# Patient Record
Sex: Male | Born: 1970 | Race: Black or African American | Hispanic: No | Marital: Married
Health system: Midwestern US, Academic
[De-identification: ages and names within clinical notes are randomized; demographics above are authoritative.]

---

## 2016-05-28 MED ORDER — ALBUTEROL SULFATE 90 MCG/ACTUATION IN HFAA
2 | RESPIRATORY_TRACT | 3 refills | Status: DC | PRN
Start: 2016-05-28 — End: 2016-07-09

## 2016-05-28 MED ORDER — FLUTICASONE-SALMETEROL 232-14 MCG/ACTUATION IN AEPB
1 | Freq: Two times a day (BID) | RESPIRATORY_TRACT | 3 refills | Status: DC
Start: 2016-05-28 — End: 2016-06-03

## 2016-06-03 MED ORDER — FLUTICASONE-SALMETEROL 232-14 MCG/ACTUATION IN AEPB
1 | Freq: Two times a day (BID) | RESPIRATORY_TRACT | 3 refills | Status: DC
Start: 2016-06-03 — End: 2019-01-05

## 2016-07-09 ENCOUNTER — Encounter: Admit: 2016-07-09 | Discharge: 2016-07-09 | Payer: No Typology Code available for payment source

## 2016-07-09 MED ORDER — ALBUTEROL SULFATE 90 MCG/ACTUATION IN HFAA
2 | RESPIRATORY_TRACT | 2 refills | Status: AC | PRN
Start: 2016-07-09 — End: ?

## 2016-07-09 MED ORDER — PREDNISONE 20 MG PO TAB
20 mg | ORAL_TABLET | Freq: Every day | ORAL | 1 refills | Status: SS
Start: 2016-07-09 — End: 2016-11-29

## 2016-07-09 NOTE — Telephone Encounter
Patient requested a script for prednisone states his voice is very raspy.  Patient denies much of a cough , when he does cough he states its very dry not productive. No fever  Reports feeling his chest is very tight or heavy.   Patients wife states in the last few weeks since the temperature has changed they have noticed his breathing getting worse or his chest feels more tight.     Offered patient appointment for 07/10/2016 at 1040am patient declined appointment. Patient was advise he could also go to urgent care if he felt like he needed prednisone.     Patient was last seen on 09/20/2015 and was to follow up on 01/23/2017 but cancelled appointment.    Patient currently has a follow up appointment scheduled for 09/25/2016 to follow up with Dr. Rondel OhBarkman.   Patients wife did request we refill the albuterol script.

## 2016-07-09 NOTE — Telephone Encounter
Per Dr. Rondel OhBarkman , we can order prednisone 20mg  take 1 tablet by mouth 4 days with 1 refill.   Patients wife notified. Scripts sent to [facility].

## 2016-07-22 ENCOUNTER — Encounter: Admit: 2016-07-22 | Discharge: 2016-07-22 | Payer: No Typology Code available for payment source

## 2016-07-22 DIAGNOSIS — T1490XA Injury, unspecified, initial encounter: Principal | ICD-10-CM

## 2016-07-23 ENCOUNTER — Encounter: Admit: 2016-07-23 | Discharge: 2016-07-23 | Payer: No Typology Code available for payment source

## 2016-07-24 ENCOUNTER — Encounter: Admit: 2016-07-24 | Discharge: 2016-07-24 | Payer: No Typology Code available for payment source

## 2016-07-26 ENCOUNTER — Encounter: Admit: 2016-07-26 | Discharge: 2016-07-26 | Payer: No Typology Code available for payment source

## 2016-11-27 ENCOUNTER — Encounter: Admit: 2016-11-27 | Discharge: 2016-11-27

## 2016-11-27 ENCOUNTER — Inpatient Hospital Stay
Admit: 2016-11-27 | Discharge: 2016-12-03 | Source: Other Acute Inpatient Hospital | Attending: Surgical Critical Care | Admitting: Trauma Surgery

## 2016-11-27 ENCOUNTER — Encounter: Admit: 2016-11-27 | Discharge: 2016-11-27 | Payer: No Typology Code available for payment source

## 2016-11-27 ENCOUNTER — Inpatient Hospital Stay: Admit: 2016-11-27 | Discharge: 2016-11-27 | Payer: No Typology Code available for payment source

## 2016-11-27 DIAGNOSIS — W3400XA Accidental discharge from unspecified firearms or gun, initial encounter: ICD-10-CM

## 2016-11-27 DIAGNOSIS — R69 Illness, unspecified: Principal | ICD-10-CM

## 2016-11-27 DIAGNOSIS — S82831B Other fracture of upper and lower end of right fibula, initial encounter for open fracture type I or II: ICD-10-CM

## 2016-11-27 DIAGNOSIS — J4520 Mild intermittent asthma, uncomplicated: ICD-10-CM

## 2016-11-27 DIAGNOSIS — M25551 Pain in right hip: ICD-10-CM

## 2016-11-27 DIAGNOSIS — S82391E Other fracture of lower end of right tibia, subsequent encounter for open fracture type I or II with routine healing: ICD-10-CM

## 2016-11-27 MED ORDER — HALOPERIDOL LACTATE 5 MG/ML IJ SOLN
1 mg | Freq: Once | INTRAVENOUS | 0 refills | Status: AC | PRN
Start: 2016-11-27 — End: ?

## 2016-11-27 MED ORDER — LACTATED RINGERS IV SOLP
1000 mL | Freq: Once | INTRAVENOUS | 0 refills | Status: CP
Start: 2016-11-27 — End: ?

## 2016-11-27 MED ORDER — ACETAMINOPHEN 325 MG PO TAB
650 mg | ORAL | 0 refills | Status: DC | PRN
Start: 2016-11-27 — End: 2016-11-28

## 2016-11-27 MED ORDER — MIDAZOLAM 1 MG/ML IJ SOLN
INTRAVENOUS | 0 refills | Status: DC
Start: 2016-11-27 — End: 2016-11-28
  Administered 2016-11-28: 04:00:00 2 mg via INTRAVENOUS

## 2016-11-27 MED ORDER — PROPOFOL INJ 10 MG/ML IV VIAL
0 refills | Status: DC
Start: 2016-11-27 — End: 2016-11-28
  Administered 2016-11-28 (×4): 10 mg via INTRAVENOUS
  Administered 2016-11-28: 04:00:00 20 mg via INTRAVENOUS
  Administered 2016-11-28: 04:00:00 50 mg via INTRAVENOUS
  Administered 2016-11-28: 04:00:00 10 mg via INTRAVENOUS
  Administered 2016-11-28: 04:00:00 20 mg via INTRAVENOUS
  Administered 2016-11-28 (×2): 10 mg via INTRAVENOUS
  Administered 2016-11-28: 04:00:00 50 mg via INTRAVENOUS
  Administered 2016-11-28: 04:00:00 20 mg via INTRAVENOUS
  Administered 2016-11-28 (×2): 10 mg via INTRAVENOUS

## 2016-11-27 MED ORDER — HYDROMORPHONE (PF) 2 MG/ML IJ SYRG
1 mg | INTRAVENOUS | 0 refills | Status: DC | PRN
Start: 2016-11-27 — End: 2016-11-28

## 2016-11-27 MED ORDER — OXYCODONE 5 MG PO TAB
5-10 mg | ORAL | 0 refills | Status: DC | PRN
Start: 2016-11-27 — End: 2016-11-28

## 2016-11-27 MED ORDER — FENTANYL CITRATE (PF) 50 MCG/ML IJ SOLN
50 ug | Freq: Once | INTRAVENOUS | 0 refills | Status: CP
Start: 2016-11-27 — End: ?
  Administered 2016-11-28: 01:00:00 50 ug via INTRAVENOUS

## 2016-11-27 MED ORDER — AMPICILLIN/SULBACTAM 3G/100ML NS IVPB (MB+)
3 g | INTRAVENOUS | 0 refills | Status: DC
Start: 2016-11-27 — End: 2016-11-28
  Administered 2016-11-28 (×4): 3 g via INTRAVENOUS

## 2016-11-27 MED ORDER — ONDANSETRON HCL (PF) 4 MG/2 ML IJ SOLN
4 mg | INTRAVENOUS | 0 refills | Status: DC | PRN
Start: 2016-11-27 — End: 2016-12-03

## 2016-11-27 MED ORDER — FLUTICASONE-SALMETEROL 250-50 MCG/DOSE IN DSDV
1 | Freq: Two times a day (BID) | RESPIRATORY_TRACT | 0 refills | Status: DC
Start: 2016-11-27 — End: 2016-12-03
  Administered 2016-11-28: 11:00:00 1 via RESPIRATORY_TRACT

## 2016-11-27 MED ORDER — FENTANYL CITRATE (PF) 50 MCG/ML IJ SOLN
50 ug | INTRAVENOUS | 0 refills | Status: DC | PRN
Start: 2016-11-27 — End: 2016-11-28

## 2016-11-27 MED ORDER — ALBUTEROL SULFATE 90 MCG/ACTUATION IN HFAA
2 | RESPIRATORY_TRACT | 0 refills | Status: DC | PRN
Start: 2016-11-27 — End: 2016-12-03
  Administered 2016-12-01: 05:00:00 2 via RESPIRATORY_TRACT

## 2016-11-27 MED ORDER — HELP MEDICATION
Freq: Two times a day (BID) | 0 refills | Status: DC
Start: 2016-11-27 — End: 2016-11-28

## 2016-11-27 MED ORDER — FENTANYL CITRATE (PF) 50 MCG/ML IJ SOLN
50 ug | Freq: Once | INTRAVENOUS | 0 refills | Status: CP
Start: 2016-11-27 — End: ?
  Administered 2016-11-28: 50 ug via INTRAVENOUS

## 2016-11-27 MED ORDER — PROMETHAZINE 25 MG/ML IJ SOLN
6.25 mg | INTRAVENOUS | 0 refills | Status: DC | PRN
Start: 2016-11-27 — End: 2016-11-28

## 2016-11-27 MED ORDER — FENTANYL CITRATE (PF) 50 MCG/ML IJ SOLN
0 refills | Status: DC
Start: 2016-11-27 — End: 2016-11-28
  Administered 2016-11-28: 04:00:00 50 ug via INTRAVENOUS

## 2016-11-27 MED ORDER — HYDROMORPHONE (PF) 2 MG/ML IJ SYRG
1 mg | Freq: Once | INTRAVENOUS | 0 refills | Status: CP
Start: 2016-11-27 — End: ?
  Administered 2016-11-28: 02:00:00 1 mg via INTRAVENOUS

## 2016-11-27 MED ORDER — FENTANYL CITRATE (PF) 50 MCG/ML IJ SOLN
25 ug | INTRAVENOUS | 0 refills | Status: DC | PRN
Start: 2016-11-27 — End: 2016-11-28

## 2016-11-27 MED ORDER — FENTANYL CITRATE (PF) 50 MCG/ML IJ SOLN
25-50 ug | INTRAVENOUS | 0 refills | Status: DC | PRN
Start: 2016-11-27 — End: 2016-11-28

## 2016-11-27 MED ORDER — HYDROMORPHONE IN 0.9 % NACL 11 MG/55 ML IJ SPCA (COPY)
INTRAVENOUS | 0 refills | Status: DC
Start: 2016-11-27 — End: 2016-11-28
  Administered 2016-11-28: 06:00:00 55.000 mL via INTRAVENOUS

## 2016-11-27 MED ORDER — KETAMINE 10 MG/ML IJ SOLN
0 refills | Status: DC
Start: 2016-11-27 — End: 2016-11-28
  Administered 2016-11-28 (×3): 10 mg via INTRAVENOUS

## 2016-11-27 MED ORDER — NALOXONE 0.4 MG/ML IJ SOLN
.08 mg | INTRAVENOUS | 0 refills | Status: DC | PRN
Start: 2016-11-27 — End: 2016-11-28

## 2016-11-27 MED ORDER — ONDANSETRON HCL (PF) 4 MG/2 ML IJ SOLN
4 mg | Freq: Once | INTRAVENOUS | 0 refills | Status: AC | PRN
Start: 2016-11-27 — End: ?

## 2016-11-27 MED ORDER — LACTATED RINGERS IV SOLP
0 refills | Status: DC
Start: 2016-11-27 — End: 2016-11-28
  Administered 2016-11-28: 03:00:00 via INTRAVENOUS
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
## 2016-11-28 ENCOUNTER — Inpatient Hospital Stay: Admit: 2016-11-28 | Discharge: 2016-11-28 | Payer: No Typology Code available for payment source

## 2016-11-28 ENCOUNTER — Encounter: Admit: 2016-11-28 | Discharge: 2016-11-28 | Payer: No Typology Code available for payment source

## 2016-11-28 DIAGNOSIS — M25551 Pain in right hip: ICD-10-CM

## 2016-11-28 DIAGNOSIS — W3400XA Accidental discharge from unspecified firearms or gun, initial encounter: ICD-10-CM

## 2016-11-28 DIAGNOSIS — J45909 Unspecified asthma, uncomplicated: Principal | ICD-10-CM

## 2016-11-28 LAB — COMPREHENSIVE METABOLIC PANEL
Lab: 0.9 mg/dL (ref 0.3–1.2)
Lab: 106 MMOL/L — ABNORMAL LOW (ref 98–110)
Lab: 136 MMOL/L — ABNORMAL LOW (ref 137–147)
Lab: 21 U/L (ref 7–56)
Lab: 22 MMOL/L (ref 21–30)
Lab: 3.6 g/dL (ref 3.5–5.0)
Lab: 36 U/L (ref 7–40)
Lab: 4.1 MMOL/L — ABNORMAL LOW (ref 3.5–5.1)
Lab: 8 (ref 3–12)
Lab: >60 mL/min (ref >60)

## 2016-11-28 LAB — PHOSPHORUS: Lab: 3.6 mg/dL (ref 2.0–4.5)

## 2016-11-28 LAB — CBC: Lab: 12 10*3/uL — ABNORMAL HIGH (ref 4.5–11.0)

## 2016-11-28 LAB — MAGNESIUM: Lab: 1.9 mg/dL (ref 1.6–2.6)

## 2016-11-28 MED ORDER — ACETAMINOPHEN 325 MG PO TAB
650 mg | ORAL | 0 refills | Status: DC
Start: 2016-11-28 — End: 2016-12-01
  Administered 2016-11-28 – 2016-12-01 (×11): 650 mg via ORAL

## 2016-11-28 MED ORDER — OXYCODONE 5 MG PO TAB
5-10 mg | ORAL | 0 refills | Status: DC | PRN
Start: 2016-11-28 — End: 2016-11-28
  Administered 2016-11-28: 22:00:00 10 mg via ORAL

## 2016-11-28 MED ORDER — MEPERIDINE (PF) 25 MG/ML IJ SYRG
12.5 mg | INTRAVENOUS | 0 refills | Status: DC | PRN
Start: 2016-11-28 — End: 2016-11-28

## 2016-11-28 MED ORDER — PHENYLEPHRINE IN 0.9% NACL(PF) 1 MG/10 ML (100 MCG/ML) IV SYRG
INTRAVENOUS | 0 refills | Status: DC
Start: 2016-11-28 — End: 2016-11-28
  Administered 2016-11-28 (×3): 100 ug via INTRAVENOUS

## 2016-11-28 MED ORDER — FENTANYL CITRATE (PF) 50 MCG/ML IJ SOLN
50 ug | INTRAVENOUS | 0 refills | Status: DC | PRN
Start: 2016-11-28 — End: 2016-11-28

## 2016-11-28 MED ORDER — HALOPERIDOL LACTATE 5 MG/ML IJ SOLN
1 mg | Freq: Once | INTRAVENOUS | 0 refills | Status: DC | PRN
Start: 2016-11-28 — End: 2016-11-28

## 2016-11-28 MED ORDER — LIDOCAINE (PF) 200 MG/10 ML (2 %) IJ SYRG
0 refills | Status: DC
Start: 2016-11-28 — End: 2016-11-28
  Administered 2016-11-28: 19:00:00 100 mg via INTRAVENOUS

## 2016-11-28 MED ORDER — HYDROMORPHONE (PF) 2 MG/ML IJ SYRG
1 mg | INTRAVENOUS | 0 refills | Status: DC | PRN
Start: 2016-11-28 — End: 2016-11-28

## 2016-11-28 MED ORDER — DIPHENHYDRAMINE HCL 50 MG/ML IJ SOLN
25 mg | Freq: Once | INTRAVENOUS | 0 refills | Status: DC | PRN
Start: 2016-11-28 — End: 2016-11-28

## 2016-11-28 MED ORDER — LACTATED RINGERS IV SOLP
1000 mL | INTRAVENOUS | 0 refills | Status: DC
Start: 2016-11-28 — End: 2016-11-30
  Administered 2016-11-28: 18:00:00 1000.000 mL via INTRAVENOUS

## 2016-11-28 MED ORDER — METHOCARBAMOL 750 MG PO TAB
750 mg | Freq: Two times a day (BID) | ORAL | 0 refills | Status: DC
Start: 2016-11-28 — End: 2016-11-29
  Administered 2016-11-28 – 2016-11-29 (×3): 750 mg via ORAL

## 2016-11-28 MED ORDER — SENNOSIDES 8.6 MG PO TAB
1 | Freq: Two times a day (BID) | ORAL | 0 refills | Status: DC
Start: 2016-11-28 — End: 2016-12-03
  Administered 2016-11-29 – 2016-12-02 (×8): 1 via ORAL

## 2016-11-28 MED ORDER — POLYETHYLENE GLYCOL 3350 17 GRAM PO PWPK
1 | Freq: Every day | ORAL | 0 refills | Status: DC
Start: 2016-11-28 — End: 2016-12-03
  Administered 2016-11-29 – 2016-12-02 (×4): 17 g via ORAL

## 2016-11-28 MED ORDER — KETAMINE 10 MG/ML IJ SOLN
0 refills | Status: DC
Start: 2016-11-28 — End: 2016-11-28
  Administered 2016-11-28: 19:00:00 30 mg via INTRAVENOUS

## 2016-11-28 MED ORDER — FENTANYL CITRATE (PF) 50 MCG/ML IJ SOLN
25 ug | INTRAVENOUS | 0 refills | Status: DC | PRN
Start: 2016-11-28 — End: 2016-11-28

## 2016-11-28 MED ORDER — DEXAMETHASONE SODIUM PHOSPHATE 4 MG/ML IJ SOLN
INTRAVENOUS | 0 refills | Status: DC
Start: 2016-11-28 — End: 2016-11-28
  Administered 2016-11-28: 19:00:00 4 mg via INTRAVENOUS

## 2016-11-28 MED ORDER — OXYCODONE 5 MG PO TAB
5-10 mg | Freq: Once | ORAL | 0 refills | Status: DC | PRN
Start: 2016-11-28 — End: 2016-11-28

## 2016-11-28 MED ORDER — ROCURONIUM 10 MG/ML IV SOLN
INTRAVENOUS | 0 refills | Status: DC
Start: 2016-11-28 — End: 2016-11-28
  Administered 2016-11-28: 19:00:00 20 mg via INTRAVENOUS
  Administered 2016-11-28: 19:00:00 50 mg via INTRAVENOUS

## 2016-11-28 MED ORDER — PROPOFOL INJ 10 MG/ML IV VIAL
0 refills | Status: DC
Start: 2016-11-28 — End: 2016-11-28
  Administered 2016-11-28: 19:00:00 200 mg via INTRAVENOUS

## 2016-11-28 MED ORDER — OXYCODONE 5 MG PO TAB
5-15 mg | ORAL | 0 refills | Status: DC | PRN
Start: 2016-11-28 — End: 2016-12-02
  Administered 2016-11-28 – 2016-11-29 (×2): 5 mg via ORAL
  Administered 2016-11-29: 22:00:00 15 mg via ORAL
  Administered 2016-11-29: 07:00:00 10 mg via ORAL
  Administered 2016-11-29: 18:00:00 15 mg via ORAL
  Administered 2016-11-29 (×3): 10 mg via ORAL
  Administered 2016-11-30 – 2016-12-02 (×14): 15 mg via ORAL

## 2016-11-28 MED ORDER — ONDANSETRON HCL (PF) 4 MG/2 ML IJ SOLN
INTRAVENOUS | 0 refills | Status: DC
Start: 2016-11-28 — End: 2016-11-28
  Administered 2016-11-28: 19:00:00 4 mg via INTRAVENOUS

## 2016-11-28 MED ORDER — SUGAMMADEX 100 MG/ML IV SOLN
INTRAVENOUS | 0 refills | Status: DC
Start: 2016-11-28 — End: 2016-11-28
  Administered 2016-11-28: 19:00:00 200 mg via INTRAVENOUS

## 2016-11-28 MED ORDER — DEXMEDETOMIDINE 4 MCG/ML (INFUSION)(AM)(OR)
0 refills | Status: DC
Start: 2016-11-28 — End: 2016-11-28
  Administered 2016-11-28: 19:00:00 1 ug/kg/h via INTRAVENOUS

## 2016-11-28 MED ORDER — DEXTRAN 70-HYPROMELLOSE (PF) 0.1-0.3 % OP DPET
0 refills | Status: DC
Start: 2016-11-28 — End: 2016-11-28
  Administered 2016-11-28: 19:00:00 1 [drp] via OPHTHALMIC

## 2016-11-28 MED ORDER — PROMETHAZINE 25 MG/ML IJ SOLN
6.25 mg | INTRAVENOUS | 0 refills | Status: DC | PRN
Start: 2016-11-28 — End: 2016-11-28

## 2016-11-28 MED ORDER — FENTANYL CITRATE (PF) 50 MCG/ML IJ SOLN
25-50 ug | INTRAVENOUS | 0 refills | Status: DC | PRN
Start: 2016-11-28 — End: 2016-11-29
  Administered 2016-11-29: 16:00:00 50 ug via INTRAVENOUS

## 2016-11-28 MED ORDER — BACITRACIN ZINC 500 UNIT/GRAM TP OINT
Freq: Every day | TOPICAL | 0 refills | Status: DC
Start: 2016-11-28 — End: 2016-12-03
  Administered 2016-11-28: 14:00:00 via TOPICAL

## 2016-11-28 MED ORDER — MIDAZOLAM 1 MG/ML IJ SOLN
INTRAVENOUS | 0 refills | Status: DC
Start: 2016-11-28 — End: 2016-11-28
  Administered 2016-11-28: 18:00:00 2 mg via INTRAVENOUS

## 2016-11-28 MED ORDER — FENTANYL CITRATE (PF) 50 MCG/ML IJ SOLN
0 refills | Status: DC
Start: 2016-11-28 — End: 2016-11-28
  Administered 2016-11-28: 19:00:00 100 ug via INTRAVENOUS

## 2016-11-28 MED ORDER — CEFAZOLIN INJ 1GM IVP
2 g | INTRAVENOUS | 0 refills | Status: CP
Start: 2016-11-28 — End: ?
  Administered 2016-11-29 (×3): 2 g via INTRAVENOUS

## 2016-11-28 MED ADMIN — SODIUM CHLORIDE 0.9 % IV SOLP [27838]: 1000 mL | INTRAVENOUS | @ 06:00:00 | Stop: 2016-11-28 | NDC 00338004904

## 2016-11-28 MED ADMIN — FENTANYL CITRATE (PF) 50 MCG/ML IJ SOLN [3037]: 100 ug | INTRAVENOUS | @ 02:00:00 | Stop: 2016-11-28 | NDC 00409909412

## 2016-11-28 MED ADMIN — LACTATED RINGERS IV SOLP [4318]: 1000 mL | INTRAVENOUS | @ 03:00:00 | Stop: 2016-11-28 | NDC 00338011704
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
## 2016-11-28 NOTE — Progress Notes
Patient's wedding band and bandana placed in safe at admissions.    Envelope #: [id number]    Reciept placed in paper chart.

## 2016-11-28 NOTE — Progress Notes
Chaplain Note: Chaplain prayed with patient before his surgery.     Admit Date: 11/27/2016      Date/Time:                      User:                                     11/27/2016 10:18 PM Carroll Kinds      PCU 3 PCU             The spiritual care team is available as needed, 24/7, through the campus switchboard [phone]).  For immediate response, please page [phone].  For a response within 24 hours, please submit an order in [facility] for a chaplain consult or call the administrative voicemail at [phone].
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
## 2016-11-28 NOTE — Anesthesia Pain Rounding
Anesthesia Follow-Up Evaluation: Post-Procedure Day One    Name: Bill [name]     MRN: [id number]     DOB: 1970-10-31     Age: 46 y.o.     Sex: male   __________________________________________________________________________     Procedure Date: 11/27/2016   Procedure: Procedure(s):  CLOSED TREATMENT ANKLE DISLOCATION:  closed reduction and splinting    Physical Assessment     Weight: 108.7 kg (239 lb 10.2 oz)    Vital Signs (Last Filed in 24 hours)  BP: 124/74 (11/01 0727)  Temp: 37 ???C (98.6 ???F) (11/01 0727)  Pulse: 74 (11/01 0727)  Respirations: 16 PER MINUTE (11/01 0727)  SpO2: 96 % (11/01 0727)  O2 Delivery: None (Room Air) (11/01 0727)  SpO2 Pulse: 59 (10/31 2345)    Patient History   Allergies  No Known Allergies     Medications  Scheduled Meds:  acetaminophen (TYLENOL) tablet 650 mg 650 mg Oral Q6H*   ampicillin/sulbactam (UNASYN) 3 g in sodium chloride 0.9% (NS) 100 mL IVPB (MB+) 3 g Intravenous Q6H*   bacitracin topical ointment  Topical QDAY   fluticasone/salmeterol (ADVAIR DISKUS) 250/50 mcg inhalation disk 1 puff 1 puff Inhalation BID   methocarbamol (ROBAXIN) tablet 750 mg 750 mg Oral BID   polyethylene glycol 3350 (MIRALAX) packet 17 g 1 packet Oral QDAY   senna (SENOKOT) tablet 1 tablet 1 tablet Oral BID   Continuous Infusions:  ??? HYDROmorphone (DILAUDID) PCA   11 mg/NS 55mL infusion syr (std conc)(premade)       PRN and Respiratory Meds:albuterol Q6H PRN, naloxone PRN, ondansetron (ZOFRAN) IV Q6H PRN, oxyCODONE Q4H PRN      Diagnostic Tests  Hematology: Lab Results   Component Value Date    HGB 9.9 11/28/2016    HCT 30.1 11/28/2016    PLTCT 208 11/28/2016    WBC 12.4 11/28/2016    MCV 88.5 11/28/2016    MCH 28.9 11/28/2016    MCHC 32.7 11/28/2016    MPV 7.8 11/28/2016    RDW 14.0 11/28/2016         General Chemistry: Lab Results   Component Value Date    NA 136 11/28/2016    K 4.1 11/28/2016    CL 106 11/28/2016    CO2 22 11/28/2016    GAP 8 11/28/2016    BUN 12 11/28/2016 CR 0.78 11/28/2016    GLU 120 11/28/2016    CA 8.6 11/28/2016    ALBUMIN 3.6 11/28/2016    MG 1.9 11/28/2016    TOTBILI 0.9 11/28/2016    PO4 3.6 11/28/2016      Coagulation: No results found for: PT, PTT, INR      Follow-Up Assessment  Patient location during evaluation: floor      Anesthetic Complications:   Anesthetic complications: The patient did not experience any anesthestic complications.      Pain:    Management:adequate     Level of Consciousness: responsive to verbal stimuli   Hydration:acceptable     Airway Patency: patent   Respiratory Status: acceptable and room air     Cardiovascular Status:acceptable   Regional/Neuroaxial:              Comments: PCA   pt appears to be resting comfortably in bed at this time.
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
## 2016-11-28 NOTE — Progress Notes
Chaplain services provided to patient prior to closed reduction.
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
## 2016-11-28 NOTE — Progress Notes
OCCUPATIONAL THERAPY  NOTE    Occupational Therapy orders received and appreciated; chart reviewed. Patient to OR this date with Ortho Plan for operative fixation of R ankle fx/dislocation. OT will follow-up post-operatively to complete evaluation/provide intervention as indicated.    Bertram Millard, OTR/L [phone]

## 2016-11-28 NOTE — Anesthesia Post-Procedure Evaluation
Post-Anesthesia Evaluation    Name: Bill [name]      MRN: [id number]     DOB: [birth year]     Age: 46 y.o.     Sex: male   __________________________________________________________________________     Procedure Date: 11/27/2016  Procedure: Procedure(s):  CLOSED TREATMENT ANKLE DISLOCATION:  closed reduction and splinting      Surgeon: Surgeon(s):  Dorothea Glassman, MD    Post-Anesthesia Vitals  BP: 125/71 (10/31 2330)  Pulse: 53 (10/31 2330)  Respirations: 13 PER MINUTE (10/31 2330)  SpO2: 100 % (10/31 2330)  O2 Delivery: Nasal Cannula (10/31 2330)  SpO2 Pulse: 52 (10/31 2330)      Post Anesthesia Evaluation Note    Evaluation location: Pre/Post  Patient participation: recovered; patient participated in evaluation  Level of consciousness: alert    Pain score: 3  Pain management: adequate    Hydration: normovolemia  Temperature: 36.0C - 38.4C  Airway patency: adequate    Perioperative Events  Perioperative events:  no       Post-op nausea and vomiting: no PONV    Postoperative Status  Cardiovascular status: hemodynamically stable  Respiratory status: spontaneous ventilation  Follow-up needed: none        Perioperative Events  Perioperative Event: No

## 2016-11-28 NOTE — Progress Notes
RT Adult Assessment Note    NAME:Bill [name]             MRN: [id number]             DOB:07-19-1970          AGE: 46 y.o.  ADMISSION DATE: 11/27/2016             DAYS ADMITTED: LOS: 0 days    RT Treatment Plan:  Protocol Plan: Medications  Albuterol: MDI PRN (home regiment)    Protocol Plan: Procedures  Comment: Advair BID    Additional Comments:  Impressions of the patient: laying in bed, no respiratory distress noted      Vital Signs:  Pulse: Pulse: 76  RR: Respirations: 18 PER MINUTE  SpO2: SpO2: 98 %  O2 Device:    Liter Flow:    O2%: O2 Percent: 21 %  Breath Sounds: All Breath Sounds: Clear (implies normal)  Respiratory Effort: Respiratory Effort: Non-Labored

## 2016-11-28 NOTE — Progress Notes
PHYSICAL THERAPY  NOTE     Plan for patient to OR this date with Ortho Plan for operative fixation of R ankle fx/dislocation. Therapy will follow-up post-operatively to complete evaluation/provide intervention as indicated.        Therapist: Romana Juniper, PT  Date: 11/28/2016
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
## 2016-11-28 NOTE — Progress Notes
Ortho Update Note    Plan for OR today for operative fixation of R ankle fx/dislocation  NPO  Type and cross  Posted/consented      Tillie Fantasia, MD  [phone]

## 2016-11-28 NOTE — Progress Notes
Patient arrived to room # [phone]) via cart accompanied by EMS. Patient transferred to the bed with assistance. Bedside safety checks completed. Initial patient assessment completed, refer to flowsheet for details. Admission skin assessment completed by: Cathlean Sauer, RN & Malva Limes, RN     Pressure Injury Present on Hospital Admission (within 24 hours): No    1. Occiput: No  2. Ear: No  3. Scapula: No  4. Spinous Process: No  5. Shoulder: No  6. Elbow: No  7. Iliac Crest: No  8. Sacrum/Coccyx: No  9. Ischial Tuberosity: No  10. Trochanter: No  11. Knee: No  12. Malleolus: No  13. Heel: No  14. Toes: No  15. Assessed for device associated injury Yes  16. Nursing Nutrition Assessment Completed Yes    See Doc Flowsheet for additional wound details.     INTERVENTIONS:   Assessment completed, refer to flowsheet for details. Orders released, reviewed, and implemented as appropriate. Oriented to surroundings, call light within reach. Plan of care reviewed.  Will continue to monitor and assess.

## 2016-11-29 ENCOUNTER — Encounter: Admit: 2016-11-29 | Discharge: 2016-11-29 | Payer: No Typology Code available for payment source

## 2016-11-29 DIAGNOSIS — W3400XA Accidental discharge from unspecified firearms or gun, initial encounter: ICD-10-CM

## 2016-11-29 DIAGNOSIS — J45909 Unspecified asthma, uncomplicated: Principal | ICD-10-CM

## 2016-11-29 MED ORDER — ENOXAPARIN 30 MG/0.3 ML SC SYRG
30 mg | Freq: Two times a day (BID) | SUBCUTANEOUS | 0 refills | Status: DC
Start: 2016-11-29 — End: 2016-12-03
  Administered 2016-11-29 – 2016-12-02 (×6): 30 mg via SUBCUTANEOUS

## 2016-11-29 MED ORDER — METHOCARBAMOL 750 MG PO TAB
750 mg | Freq: Four times a day (QID) | ORAL | 0 refills | Status: DC
Start: 2016-11-29 — End: 2016-12-03
  Administered 2016-11-29 – 2016-12-02 (×14): 750 mg via ORAL

## 2016-11-29 MED ORDER — GABAPENTIN 300 MG PO CAP
300 mg | ORAL | 0 refills | Status: DC
Start: 2016-11-29 — End: 2016-12-02
  Administered 2016-11-29 – 2016-12-02 (×8): 300 mg via ORAL

## 2016-11-29 MED ORDER — HYDROMORPHONE (PF) 2 MG/ML IJ SYRG
1 mg | INTRAVENOUS | 0 refills | Status: DC | PRN
Start: 2016-11-29 — End: 2016-11-30
  Administered 2016-11-29 – 2016-11-30 (×2): 1 mg via INTRAVENOUS
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
## 2016-11-29 NOTE — Progress Notes
Chaplain Note: Chaplain attempted visit, however pt received phone call at the same time. Chaplain will return at a more convenient time for patient.    Admit Date: 11/27/2016      Date/Time:                      User:                                    11/29/2016 3:29 PM Carroll Kinds      PCU 2 PCU
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
## 2016-11-30 NOTE — Care Plan
Problem: Discharge Planning  Goal: Participation in plan of care  Outcome: Goal Ongoing  Pt participating and verbalizes the plan of care during this shift. Will continue to monitor    Goal: Knowledge regarding plan of care  Outcome: Goal Ongoing  Pt understands and verbalizes plan of care during this shift. Pt's education binder will be updated and placed in the pt's wall unit. Will continue to monitor   Goal: Prepared for discharge  Outcome: Goal Ongoing  Discharge planning ongoing. Will continue to monitor   ???  ???  Problem: Pain  Goal: Management of pain  Outcome: Goal Ongoing  Pt has reported 10/10 pain in his right leg during this shift. Pt has a dilaudid PCA and has been encouraged to press it when in pain. Pt is currently resting. Will continue to monitor   Goal: Knowledge of pain management  Outcome: Goal Ongoing  Pt educated and verbalizes understanding of pain management during this shift. Will continue to monitor   ???  Problem: Respiratory Impairment (Non-Ventilated Patient)  Goal: Effective gas exchange  Outcome: Goal Ongoing  Pt's SPO2 has stayed >92% on room air during this shift. No signs or symptoms of ineffective gas exchange during this shift. Pt's gas exchange is being monitored by capnography. Will continue to monitor   Goal: Effective breathing pattern  Outcome: Add Note  Pt's breathing was non-labored and chest expansion was symmetrical during this shift. No signs or symptoms of ineffective breathing patterns during this shift. Will continue to monitor   Goal: Patent airway  Outcome: Goal Ongoing  Pt has maintained a patent airway during this shift. Will continue to monitor   ???  Problem: Tissue Perfusion, Altered  Goal: Adequate tissue perfusion  Outcome: Goal Ongoing  Pt's pulses are palpable, capillary refill is less than 3 seconds, and extremities are warm. No signs or symptoms of inadequate tissue perfusion. Will continue to monitor   ???  Problem: Skin Integrity  Goal: Skin integrity intact Outcome: Goal Ongoing  Pt's skin is healing and no new skin breakdown during this shift. Pt's dressings are clean, dry, and intact. Will continue to monitor   ???  Problem: Falls, High Risk of  Goal: Absence of falls-Adult Patient  Outcome: Goal Ongoing  Pt has had no falls during this shift. Fall bundle in place. Will continue to monitor   ???  ???
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
## 2016-11-30 NOTE — Progress Notes
RESPIRATORY THERAPY  ADULT PROTOCOL EVALUATION      RESPIRATORY PROTOCOL PLAN    Medications       Note: If indicated by protocol, medication orders will be placed by therapist.    Procedures          _____________________________________________________________    PATIENT EVALUATION RESULTS    Chart Review  * Pulmonary Hx: Hx pulmonary disease, hx reactive or obstructive airway disease (PEFR & AM) OR regular home use of bronchodilators (AM) OR inhaled or systemic steroid use for lungs < or equal to 4 times/yr (AM)    * Surgical Hx: No surgery OR last surgery > 6 weeks ago OR trach/stoma (BA)    * Chest X-Ray: Clear OR not available    * PFT/Oxygenation: FEV1, PEFR > 80% predicted OR physically unable to perform OR Pa02 >80 RA OR Sp02 >95% RA      Patient Assessment  * Respiratory Pattern: Regular pattern and rate OR good chest excursion with deep breathing    * Breath Sounds: Clear and able to auscultate bases posteriorly    * Cough / Sputum: Strong, effective cough OR nonproductive    * Mental Status: Alert, oriented, cooperative    * Activity Level: Ambulatory      Priority Index  Total Points: 3 Points  * Priority Index: 1+    PRIORITY INDEX GUIDELINES*  Priority Points   1 0-9 points   2 9-18 points   3 > 18 points   + Pulm Dx or Home Rx   *Higher points indicate higher acuity.      Therapist: Mont Dutton, RT  Date: 11/30/2016      Key  AC = Airway clearance  AM = Aerosolized medication  BA = Bland aerosol  DB&C = Deep breathe & cough  FEV1 = Forced expiratory volume in first second)  IC = Inspiratory capacity  LE = Lung expansion  MDI = Metered dose inhaler  Neb = Nebulizer  O2 = Oxygen  Oxim =Oximetry  PEFR = Peak expiratory flow rate  RRT = Rapid Response Team

## 2016-12-01 MED ORDER — HYDROMORPHONE (PF) 2 MG/ML IJ SYRG
1 mg | Freq: Once | INTRAVENOUS | 0 refills | Status: CP
Start: 2016-12-01 — End: ?
  Administered 2016-12-01: 21:00:00 1 mg via INTRAVENOUS

## 2016-12-01 MED ORDER — ACETAMINOPHEN 500 MG PO TAB
1000 mg | ORAL | 0 refills | Status: DC
Start: 2016-12-01 — End: 2016-12-03
  Administered 2016-12-01 – 2016-12-02 (×4): 1000 mg via ORAL
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
## 2016-12-02 ENCOUNTER — Encounter: Admit: 2016-12-02 | Discharge: 2016-12-02 | Payer: No Typology Code available for payment source

## 2016-12-02 DIAGNOSIS — J45909 Unspecified asthma, uncomplicated: Principal | ICD-10-CM

## 2016-12-02 DIAGNOSIS — W3400XA Accidental discharge from unspecified firearms or gun, initial encounter: ICD-10-CM

## 2016-12-02 MED ORDER — MAGNESIUM HYDROXIDE 2,400 MG/10 ML PO SUSP
10 mL | Freq: Once | ORAL | 0 refills | Status: DC
Start: 2016-12-02 — End: 2016-12-03

## 2016-12-02 MED ORDER — POLYETHYLENE GLYCOL 3350 17 GRAM PO PWPK
17 g | Freq: Every day | ORAL | 0 refills | 18.00000 days | Status: AC
Start: 2016-12-02 — End: 2019-01-05

## 2016-12-02 MED ORDER — ENOXAPARIN 40 MG/0.4 ML SC SYRG
40 mg | INJECTION | Freq: Every day | SUBCUTANEOUS | 0 refills | 7.00000 days | Status: AC
Start: 2016-12-02 — End: 2019-01-05

## 2016-12-02 MED ORDER — SENNOSIDES 8.6 MG PO TAB
1 | ORAL_TABLET | Freq: Two times a day (BID) | ORAL | 3 refills | Status: AC
Start: 2016-12-02 — End: 2019-01-05

## 2016-12-02 MED ORDER — GABAPENTIN 400 MG PO CAP
400 mg | ORAL | 0 refills | Status: DC
Start: 2016-12-02 — End: 2016-12-03
  Administered 2016-12-02: 19:00:00 400 mg via ORAL

## 2016-12-02 MED ORDER — GABAPENTIN 400 MG PO CAP
400 mg | ORAL_CAPSULE | ORAL | 1 refills | Status: SS
Start: 2016-12-02 — End: 2016-12-12

## 2016-12-02 MED ORDER — ACETAMINOPHEN 500 MG PO TAB
1000 mg | ORAL | 0 refills | Status: AC | PRN
Start: 2016-12-02 — End: 2019-08-17

## 2016-12-02 MED ORDER — OXYCODONE 5 MG PO TAB
5-15 mg | ORAL | 0 refills | Status: DC | PRN
Start: 2016-12-02 — End: 2016-12-02
  Administered 2016-12-02: 12:00:00 15 mg via ORAL

## 2016-12-02 MED ORDER — TRAMADOL 50 MG PO TAB
50-100 mg | ORAL_TABLET | ORAL | 0 refills | Status: SS | PRN
Start: 2016-12-02 — End: 2016-12-12

## 2016-12-02 MED ORDER — BACITRACIN ZINC 500 UNIT/GRAM TP OINT
0 refills | 14.00000 days | Status: AC
Start: 2016-12-02 — End: 2019-01-05

## 2016-12-02 MED ORDER — TRAMADOL 50 MG PO TAB
50-100 mg | ORAL | 0 refills | Status: DC | PRN
Start: 2016-12-02 — End: 2016-12-03
  Administered 2016-12-02 (×2): 50 mg via ORAL

## 2016-12-02 MED ORDER — METHOCARBAMOL 750 MG PO TAB
750 mg | ORAL_TABLET | Freq: Four times a day (QID) | ORAL | 1 refills | Status: AC
Start: 2016-12-02 — End: ?
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
## 2016-12-02 NOTE — Progress Notes
Time of Fall:  1250    Objective (location/activity at time):  Patient found on the floor.    Subjective:  Patient said  He was trying to transfer himself from  Wheelchair to bed and feel    MD/NP and Family Notified:   NP was notified.    Assessment:   assessment was  Done, pt denied hitting his head.        Follow-up Action (Diagnostic/Additional Safety Precautions):  Fall precaution are reimplimented.
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
## 2016-12-02 NOTE — Discharge Planning (AHS/AVS)
Prescription Drug Discount Resource  https://www.goodrx.com or go to m.goodrx.com from any mobile phone.     GoodRx gathers current prices and discounts to help you find the lowest cost pharmacy for your prescriptions. GoodRx is 100% free. No personal information required.
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
## 2016-12-03 ENCOUNTER — Inpatient Hospital Stay: Admit: 2016-11-27 | Discharge: 2016-11-27 | Payer: No Typology Code available for payment source

## 2016-12-03 ENCOUNTER — Encounter: Admit: 2016-12-03 | Discharge: 2016-12-03 | Payer: No Typology Code available for payment source

## 2016-12-03 ENCOUNTER — Inpatient Hospital Stay: Admit: 2016-11-28 | Discharge: 2016-11-28 | Payer: No Typology Code available for payment source

## 2016-12-03 DIAGNOSIS — G8911 Acute pain due to trauma: ICD-10-CM

## 2016-12-03 DIAGNOSIS — T797XXA Traumatic subcutaneous emphysema, initial encounter: ICD-10-CM

## 2016-12-03 DIAGNOSIS — Z79899 Other long term (current) drug therapy: ICD-10-CM

## 2016-12-03 DIAGNOSIS — R110 Nausea: ICD-10-CM

## 2016-12-03 DIAGNOSIS — D62 Acute posthemorrhagic anemia: ICD-10-CM

## 2016-12-03 DIAGNOSIS — S9304XA Dislocation of right ankle joint, initial encounter: ICD-10-CM

## 2016-12-03 DIAGNOSIS — S81841A Puncture wound with foreign body, right lower leg, initial encounter: ICD-10-CM

## 2016-12-03 DIAGNOSIS — Z7409 Other reduced mobility: ICD-10-CM

## 2016-12-03 DIAGNOSIS — J45909 Unspecified asthma, uncomplicated: ICD-10-CM

## 2016-12-03 DIAGNOSIS — S31824A Puncture wound with foreign body of left buttock, initial encounter: ICD-10-CM

## 2016-12-03 DIAGNOSIS — R402413 Glasgow coma scale score 13-15, at hospital admission: ICD-10-CM

## 2016-12-03 DIAGNOSIS — D72829 Elevated white blood cell count, unspecified: ICD-10-CM

## 2016-12-03 DIAGNOSIS — F17210 Nicotine dependence, cigarettes, uncomplicated: ICD-10-CM

## 2016-12-03 DIAGNOSIS — I10 Essential (primary) hypertension: ICD-10-CM

## 2016-12-03 DIAGNOSIS — S32612A Displaced avulsion fracture of left ischium, initial encounter for closed fracture: ICD-10-CM

## 2016-12-03 DIAGNOSIS — S82391B Other fracture of lower end of right tibia, initial encounter for open fracture type I or II: ICD-10-CM

## 2016-12-03 DIAGNOSIS — J4520 Mild intermittent asthma, uncomplicated: ICD-10-CM

## 2016-12-03 DIAGNOSIS — S82251B Displaced comminuted fracture of shaft of right tibia, initial encounter for open fracture type I or II: Principal | ICD-10-CM

## 2016-12-03 DIAGNOSIS — S82451B Displaced comminuted fracture of shaft of right fibula, initial encounter for open fracture type I or II: ICD-10-CM

## 2016-12-03 DIAGNOSIS — Z9889 Other specified postprocedural states: Principal | ICD-10-CM

## 2016-12-03 DIAGNOSIS — S82871A Displaced pilon fracture of right tibia, initial encounter for closed fracture: ICD-10-CM
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
## 2016-12-03 NOTE — Case Management (ED)
12/02/2016:  Bill [name] with [facility] picked up patient for discharge and [facility] Patient calm and cooperative.     Bill [name] ([facility]) at Phone: [phone]    Diana EvesAnnemarie Nitza Schmid, RN, BSN  Trauma & Burn Case Manager  Phone: [phone]  Fax: [phone]  Pager: [phone]

## 2016-12-09 DIAGNOSIS — I10 Essential (primary) hypertension: ICD-10-CM

## 2016-12-10 ENCOUNTER — Encounter: Admit: 2016-12-10 | Discharge: 2016-12-10 | Payer: No Typology Code available for payment source

## 2016-12-10 ENCOUNTER — Observation Stay: Admit: 2016-12-10 | Discharge: 2016-12-12

## 2016-12-10 ENCOUNTER — Encounter: Admit: 2016-12-10 | Discharge: 2016-12-11

## 2016-12-10 DIAGNOSIS — W3400XA Accidental discharge from unspecified firearms or gun, initial encounter: ICD-10-CM

## 2016-12-10 DIAGNOSIS — J45909 Unspecified asthma, uncomplicated: Principal | ICD-10-CM

## 2016-12-10 MED ORDER — BISACODYL 10 MG RE SUPP
10 mg | Freq: Every day | RECTAL | 0 refills | Status: DC | PRN
Start: 2016-12-10 — End: 2016-12-12

## 2016-12-10 MED ORDER — LIDOCAINE (PF) 10 MG/ML (1 %) IJ SOLN
.1-2 mL | INTRAMUSCULAR | 0 refills | Status: DC | PRN
Start: 2016-12-10 — End: 2016-12-10

## 2016-12-10 MED ORDER — LACTATED RINGERS IV SOLP
1000 mL | INTRAVENOUS | 0 refills | Status: DC
Start: 2016-12-10 — End: 2016-12-10
  Administered 2016-12-10: 19:00:00 1000.000 mL via INTRAVENOUS
  Administered 2016-12-10: 15:00:00 1000 mL via INTRAVENOUS

## 2016-12-10 MED ORDER — OXYCODONE 5 MG PO TAB
5-15 mg | ORAL | 0 refills | Status: DC | PRN
Start: 2016-12-10 — End: 2016-12-12
  Administered 2016-12-11: 06:00:00 10 mg via ORAL
  Administered 2016-12-11 (×3): 15 mg via ORAL
  Administered 2016-12-11: 05:00:00 5 mg via ORAL
  Administered 2016-12-11 (×2): 15 mg via ORAL
  Administered 2016-12-12 (×4): 10 mg via ORAL
  Administered 2016-12-12: 19:00:00 15 mg via ORAL

## 2016-12-10 MED ORDER — FENTANYL CITRATE (PF) 50 MCG/ML IJ SOLN
0 refills | Status: DC
Start: 2016-12-10 — End: 2016-12-10
  Administered 2016-12-10 (×2): 50 ug via INTRAVENOUS
  Administered 2016-12-10: 19:00:00 25 ug via INTRAVENOUS
  Administered 2016-12-10 (×3): 50 ug via INTRAVENOUS

## 2016-12-10 MED ORDER — HYDROMORPHONE (PF) 2 MG/ML IJ SYRG
.2-.5 mg | INTRAVENOUS | 0 refills | Status: DC | PRN
Start: 2016-12-10 — End: 2016-12-10
  Administered 2016-12-10: 22:00:00 0.2 mg via INTRAVENOUS
  Administered 2016-12-10 (×2): 0.4 mg via INTRAVENOUS

## 2016-12-10 MED ORDER — LIDOCAINE (PF) 200 MG/10 ML (2 %) IJ SYRG
0 refills | Status: DC
Start: 2016-12-10 — End: 2016-12-10
  Administered 2016-12-10: 17:00:00 100 mg via INTRAVENOUS

## 2016-12-10 MED ORDER — CEFAZOLIN INJ 1GM IVP
2 g | INTRAVENOUS | 0 refills | Status: DC
Start: 2016-12-10 — End: 2016-12-10

## 2016-12-10 MED ORDER — OXYCODONE 5 MG PO TAB
5-10 mg | ORAL | 0 refills | Status: DC | PRN
Start: 2016-12-10 — End: 2016-12-11
  Administered 2016-12-10 – 2016-12-11 (×2): 10 mg via ORAL

## 2016-12-10 MED ORDER — KETAMINE 10 MG/ML IJ SOLN
0 refills | Status: DC
Start: 2016-12-10 — End: 2016-12-10
  Administered 2016-12-10 (×2): 10 mg via INTRAVENOUS

## 2016-12-10 MED ORDER — DIPHENHYDRAMINE HCL 50 MG/ML IJ SOLN
25 mg | INTRAVENOUS | 0 refills | Status: DC | PRN
Start: 2016-12-10 — End: 2016-12-12

## 2016-12-10 MED ORDER — PROPOFOL INJ 10 MG/ML IV VIAL
0 refills | Status: DC
Start: 2016-12-10 — End: 2016-12-10
  Administered 2016-12-10: 19:00:00 50 mg via INTRAVENOUS
  Administered 2016-12-10: 19:00:00 100 mg via INTRAVENOUS
  Administered 2016-12-10: 17:00:00 50 mg via INTRAVENOUS
  Administered 2016-12-10: 17:00:00 200 mg via INTRAVENOUS

## 2016-12-10 MED ORDER — PHENYLEPHRINE IN 0.9% NACL(PF) 1 MG/10 ML (100 MCG/ML) IV SYRG
INTRAVENOUS | 0 refills | Status: DC
Start: 2016-12-10 — End: 2016-12-10
  Administered 2016-12-10: 20:00:00 100 ug via INTRAVENOUS
  Administered 2016-12-10 (×2): 200 ug via INTRAVENOUS

## 2016-12-10 MED ORDER — ACETAMINOPHEN 325 MG PO TAB
650 mg | ORAL | 0 refills | Status: DC | PRN
Start: 2016-12-10 — End: 2016-12-11
  Administered 2016-12-11 (×3): 650 mg via ORAL

## 2016-12-10 MED ORDER — ALBUTEROL SULFATE 90 MCG/ACTUATION IN HFAA
2 | RESPIRATORY_TRACT | 0 refills | Status: DC | PRN
Start: 2016-12-10 — End: 2016-12-12
  Administered 2016-12-11: 01:00:00 2 via RESPIRATORY_TRACT

## 2016-12-10 MED ORDER — ONDANSETRON HCL (PF) 4 MG/2 ML IJ SOLN
4 mg | Freq: Once | INTRAVENOUS | 0 refills | Status: DC | PRN
Start: 2016-12-10 — End: 2016-12-10

## 2016-12-10 MED ORDER — DEXTRAN 70-HYPROMELLOSE (PF) 0.1-0.3 % OP DPET
0 refills | Status: DC
Start: 2016-12-10 — End: 2016-12-10
  Administered 2016-12-10: 17:00:00 2 [drp] via OPHTHALMIC

## 2016-12-10 MED ORDER — CEFAZOLIN 1 GRAM IJ SOLR
0 refills | Status: DC
Start: 2016-12-10 — End: 2016-12-10
  Administered 2016-12-10: 17:00:00 2 g via INTRAVENOUS

## 2016-12-10 MED ORDER — MAGNESIUM HYDROXIDE 2,400 MG/10 ML PO SUSP
10 mL | Freq: Every day | ORAL | 0 refills | Status: DC
Start: 2016-12-10 — End: 2016-12-12
  Administered 2016-12-11 – 2016-12-12 (×2): 10 mL via ORAL

## 2016-12-10 MED ORDER — ACETAMINOPHEN 1,000 MG/100 ML (10 MG/ML) IV SOLN
0 refills | Status: DC
Start: 2016-12-10 — End: 2016-12-10
  Administered 2016-12-10: 18:00:00 1000 mg via INTRAVENOUS

## 2016-12-10 MED ORDER — ONDANSETRON HCL (PF) 4 MG/2 ML IJ SOLN
4 mg | INTRAVENOUS | 0 refills | Status: DC | PRN
Start: 2016-12-10 — End: 2016-12-12

## 2016-12-10 MED ORDER — CEFAZOLIN INJ 1GM IVP
2 g | INTRAVENOUS | 0 refills | Status: DC
Start: 2016-12-10 — End: 2016-12-11
  Administered 2016-12-11 (×2): 2 g via INTRAVENOUS

## 2016-12-10 MED ORDER — ENOXAPARIN 40 MG/0.4 ML SC SYRG
40 mg | Freq: Every day | SUBCUTANEOUS | 0 refills | Status: DC
Start: 2016-12-10 — End: 2016-12-12
  Administered 2016-12-12: 03:00:00 40 mg via SUBCUTANEOUS

## 2016-12-10 MED ORDER — DEXMEDETOMIDINE# 4MCG/ML IV SOLN
0 refills | Status: DC
Start: 2016-12-10 — End: 2016-12-10
  Administered 2016-12-10 (×2): 8 ug via INTRAVENOUS
  Administered 2016-12-10: 20:00:00 4 ug via INTRAVENOUS

## 2016-12-10 MED ORDER — FENTANYL CITRATE (PF) 50 MCG/ML IJ SOLN
25-50 ug | INTRAVENOUS | 0 refills | Status: DC | PRN
Start: 2016-12-10 — End: 2016-12-11
  Administered 2016-12-11 (×5): 50 ug via INTRAVENOUS

## 2016-12-10 MED ORDER — HALOPERIDOL LACTATE 5 MG/ML IJ SOLN
1 mg | Freq: Once | INTRAVENOUS | 0 refills | Status: DC | PRN
Start: 2016-12-10 — End: 2016-12-10

## 2016-12-10 MED ORDER — FLUTICASONE 50 MCG/ACTUATION NA SPSN
2 | Freq: Every day | NASAL | 0 refills | Status: DC
Start: 2016-12-10 — End: 2016-12-12
  Administered 2016-12-11: 2 via NASAL

## 2016-12-10 MED ORDER — ONDANSETRON HCL (PF) 4 MG/2 ML IJ SOLN
INTRAVENOUS | 0 refills | Status: DC
Start: 2016-12-10 — End: 2016-12-10
  Administered 2016-12-10: 19:00:00 4 mg via INTRAVENOUS

## 2016-12-10 MED ORDER — DIPHENHYDRAMINE HCL 25 MG PO CAP
25 mg | ORAL | 0 refills | Status: DC | PRN
Start: 2016-12-10 — End: 2016-12-12

## 2016-12-10 MED ORDER — FENTANYL CITRATE (PF) 50 MCG/ML IJ SOLN
25-50 ug | INTRAVENOUS | 0 refills | Status: DC | PRN
Start: 2016-12-10 — End: 2016-12-10

## 2016-12-10 MED ORDER — ALBUTEROL SULFATE 90 MCG/ACTUATION IN HFAA
2 | RESPIRATORY_TRACT | 0 refills | Status: DC | PRN
Start: 2016-12-10 — End: 2016-12-10

## 2016-12-10 MED ORDER — DEXAMETHASONE SODIUM PHOSPHATE 4 MG/ML IJ SOLN
INTRAVENOUS | 0 refills | Status: DC
Start: 2016-12-10 — End: 2016-12-10
  Administered 2016-12-10: 18:00:00 4 mg via INTRAVENOUS

## 2016-12-10 MED ORDER — HYDROCODONE-ACETAMINOPHEN 5-325 MG PO TAB
1-2 | Freq: Once | ORAL | 0 refills | Status: DC | PRN
Start: 2016-12-10 — End: 2016-12-10

## 2016-12-10 MED ORDER — MIDAZOLAM 1 MG/ML IJ SOLN
INTRAVENOUS | 0 refills | Status: DC
Start: 2016-12-10 — End: 2016-12-10
  Administered 2016-12-10 (×2): 2 mg via INTRAVENOUS

## 2016-12-10 MED ORDER — DOCUSATE SODIUM 100 MG PO CAP
100 mg | Freq: Two times a day (BID) | ORAL | 0 refills | Status: DC
Start: 2016-12-10 — End: 2016-12-12
  Administered 2016-12-11 – 2016-12-12 (×4): 100 mg via ORAL

## 2016-12-10 MED ORDER — ALBUTEROL SULFATE 2.5 MG /3 ML (0.083 %) IN NEBU
2.5 mg | Freq: Once | RESPIRATORY_TRACT | 0 refills | Status: CP
Start: 2016-12-10 — End: ?
  Administered 2016-12-10: 22:00:00 2.5 mg via RESPIRATORY_TRACT

## 2016-12-10 MED ORDER — ACETAMINOPHEN 650 MG RE SUPP
650 mg | RECTAL | 0 refills | Status: DC | PRN
Start: 2016-12-10 — End: 2016-12-11

## 2016-12-10 MED ADMIN — FENTANYL CITRATE (PF) 50 MCG/ML IJ SOLN [3037]: 25 ug | INTRAVENOUS | @ 21:00:00 | Stop: 2016-12-10 | NDC 00409909412
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
## 2016-12-10 NOTE — Anesthesia Post-Procedure Evaluation
Post-Anesthesia Evaluation    Name: Bill [name]      MRN: [id number]     DOB: 06/05/1970     Age: 46 y.o.     Sex: male   __________________________________________________________________________     Procedure Date: 12/10/2016  Procedure: Procedure(s) with comments:  REMOVAL OF EXTERNAL FIXATOR LOWER EXTREMITY - CASE LENGTH 2 HOURS  OPEN REDUCTION INTERNAL FIXATION RIGHT ANKLE  REMOVAL FOREIGN BODY LEFT LATERAL BUTTOCK, LEFT LOWER LEG      Surgeon: Surgeon(s):  Reinaldo Berberilley, Michael B, MD  Reinaldo Berberilley, Michael B, MD  Dorathy DaftFalcon, Spencer, MD    Post-Anesthesia Vitals  BP: 138/83 (11/13 1615)  Pulse: 75 (11/13 1615)  Respirations: 14 PER MINUTE (11/13 1615)  SpO2: 100 % (11/13 1615)  SpO2 Pulse: 75 (11/13 1615)      Post Anesthesia Evaluation Note    Evaluation location: Pre/Post  Patient participation: recovered; patient participated in evaluation  Level of consciousness: alert    Pain score: 0  Pain management: adequate    Hydration: normovolemia  Temperature: 36.0C - 38.4C  Airway patency: adequate    Perioperative Events  Perioperative events:  no       Post-op nausea and vomiting: no PONV    Postoperative Status  Cardiovascular status: hemodynamically stable  Respiratory status: spontaneous ventilation  Follow-up needed: none        Perioperative Events  Perioperative Event: No  Emergency Case Activation: No

## 2016-12-10 NOTE — Progress Notes
RT Adult Assessment Note    NAME:Bill [name]             MRN: [id number]             DOB:06/13/1970          AGE: 46 y.o.  ADMISSION DATE: 12/10/2016             DAYS ADMITTED: LOS: 0 days    RT Treatment Plan:  Protocol Plan: Medications  Albuterol: MDI PRN    Protocol Plan: Procedures  IPPB: Place a nursing order for "IS Q1h While Awake" for any of Lung Expansion indicators  Oxygen/Humidity: O2 to keep SpO2 > 92%  Monitoring: Pulse oximetry continuous (document SpO2 results Q4h) (OSA protocol)    Additional Comments:  Impressions of the patient: pt resting on room air  Intervention(s)/outcome(s):    Patient education that was completed:    Recommendations to the care team:      Vital Signs:  Pulse: Pulse: 70  RR: Respirations: 17 PER MINUTE  SpO2: SpO2: 99 %  O2 Device:    Liter Flow: O2 Liter Flow: 2 lpm  O2%:    Breath Sounds: All Breath Sounds: Clear (implies normal);Decreased  Respiratory Effort: Respiratory Effort: Non-Labored
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
## 2016-12-11 LAB — GRAM STAIN

## 2016-12-11 LAB — CBC: Lab: 13 10*3/uL — ABNORMAL HIGH (ref 4.5–11.0)

## 2016-12-11 LAB — BASIC METABOLIC PANEL: Lab: 136 MMOL/L — ABNORMAL LOW (ref >60)

## 2016-12-11 MED ORDER — GABAPENTIN 300 MG PO CAP
300 mg | ORAL | 0 refills | Status: DC
Start: 2016-12-11 — End: 2016-12-12
  Administered 2016-12-11 – 2016-12-12 (×4): 300 mg via ORAL

## 2016-12-11 MED ORDER — METHOCARBAMOL 750 MG PO TAB
750 mg | Freq: Four times a day (QID) | ORAL | 0 refills | Status: DC
Start: 2016-12-11 — End: 2016-12-11

## 2016-12-11 MED ORDER — GABAPENTIN 400 MG PO CAP
400 mg | ORAL | 0 refills | Status: DC
Start: 2016-12-11 — End: 2016-12-11

## 2016-12-11 MED ORDER — CEFEPIME IN DEXTROSE,ISO-OSM 2 GRAM/100 ML IV PGBK
2 g | INTRAVENOUS | 0 refills | Status: DC
Start: 2016-12-11 — End: 2016-12-12
  Administered 2016-12-11 – 2016-12-12 (×3): 2 g via INTRAVENOUS

## 2016-12-11 MED ORDER — METHOCARBAMOL 750 MG PO TAB
750 mg | ORAL | 0 refills | Status: DC | PRN
Start: 2016-12-11 — End: 2016-12-12

## 2016-12-11 MED ORDER — ACETAMINOPHEN 325 MG PO TAB
650 mg | ORAL | 0 refills | Status: DC
Start: 2016-12-11 — End: 2016-12-12
  Administered 2016-12-11 – 2016-12-12 (×5): 650 mg via ORAL

## 2016-12-11 MED ORDER — FLUTICASONE-SALMETEROL 250-50 MCG/DOSE IN DSDV
1 | Freq: Two times a day (BID) | RESPIRATORY_TRACT | 0 refills | Status: DC
Start: 2016-12-11 — End: 2016-12-12
  Administered 2016-12-11: 23:00:00 1 via RESPIRATORY_TRACT

## 2016-12-11 NOTE — Anesthesia Pain Rounding
Anesthesia Follow-Up Evaluation: Post-Procedure Day One    Name: Bill [name]     MRN: [id number]     DOB: October 28, 1970     Age: 46 y.o.     Sex: male   __________________________________________________________________________     Procedure Date: 12/10/2016   Procedure: Procedure(s) with comments:  REMOVAL OF EXTERNAL FIXATOR LOWER EXTREMITY - CASE LENGTH 2 HOURS  OPEN REDUCTION INTERNAL FIXATION RIGHT ANKLE  REMOVAL FOREIGN BODY LEFT LATERAL BUTTOCK, LEFT LOWER LEG    Physical Assessment  Height: 177.8 cm (70)  Weight: 113.4 kg (250 lb)    Vital Signs (Last Filed in 24 hours)  BP: 122/80 (11/14 0734)  Temp: 37.5 ???C (99.5 ???F) (11/14 0734)  Pulse: 76 (11/14 0734)  Respirations: 20 PER MINUTE (11/14 0734)  SpO2: 100 % (11/14 0734)  O2 Delivery: None (Room Air) (11/14 0734)  SpO2 Pulse: 71 (11/13 1645)  Height: 177.8 cm (70) (11/13 0826)    Patient History   Allergies  No Known Allergies     Medications  Scheduled Meds:  ceFAZolin (ANCEF) IVP 2 g 2 g Intravenous Q8H*   docusate (COLACE) capsule 100 mg 100 mg Oral BID(9-17)   enoxaparin (LOVENOX) syringe 40 mg 40 mg Subcutaneous QDAY(21)   fluticasone (FLONASE) nasal spray 2 spray 2 spray Each Nostril QDAY   milk of magnesia (CONC) oral suspension 10 mL 10 mL Oral QDAY(21)   Continuous Infusions:  PRN and Respiratory Meds:acetaminophen Q4H PRN **OR** acetaminophen Q4H PRN, albuterol Q4H PRN, bisacodyl QDAY PRN, diphenhydrAMINE Q6H PRN **OR** diphenhydrAMINE Q6H PRN, fentaNYL citrate PF Q1H PRN, ondansetron (ZOFRAN) IV Q6H PRN, oxyCODONE Q3H PRN      Diagnostic Tests  Hematology: Lab Results   Component Value Date    HGB 9.9 11/28/2016    HCT 30.1 11/28/2016    PLTCT 208 11/28/2016    WBC 12.4 11/28/2016    MCV 88.5 11/28/2016    MCH 28.9 11/28/2016    MCHC 32.7 11/28/2016    MPV 7.8 11/28/2016    RDW 14.0 11/28/2016         General Chemistry: Lab Results   Component Value Date    NA 136 11/28/2016    K 4.1 11/28/2016    CL 106 11/28/2016    CO2 22 11/28/2016 GAP 8 11/28/2016    BUN 12 11/28/2016    CR 0.78 11/28/2016    GLU 120 11/28/2016    CA 8.6 11/28/2016    ALBUMIN 3.6 11/28/2016    MG 1.9 11/28/2016    TOTBILI 0.9 11/28/2016    PO4 3.6 11/28/2016      Coagulation: No results found for: PT, PTT, INR      Follow-Up Assessment  Patient location during evaluation: floor      Anesthetic Complications:   Anesthetic complications: The patient did not experience any anesthestic complications.      Pain:  Score: 4       Level of Consciousness: awake   Hydration:acceptable     Airway Patency:  Respiratory Status: acceptable     Cardiovascular Status:acceptable   Regional/Neuroaxial:

## 2016-12-11 NOTE — Progress Notes
[id number] RN called to update this patient's pain is poorly controlled. [id number] I entered the room and the patient had removed his ace wrap and sof rol proximally and partially distally, the incisions were not exposed but the foot plate of the splint fell off. Splint and sof rol taken down. Dry gauze left in place small amount of SS drainage noted to the lateral ankle gauze, extra gauze applied, sof rol splint and ace applied. Elevated RLE. RLE compartments soft, compressible, wiggles toes, SILT throughout. Pedal pulse 2+ denies pain with f/e great toe. States he felt like the posterior portion of the splint was applying pressure to his skin. No breakdown noted. Pain improved with new splint application. Able to apply the splint with his foot in neutral. Updated patient on plan to d/c IV pain medications. PTA robaxin prn and gabapentin reordered.     Glenna Durand. [place] Yarbro, NP-C  [phone]
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
## 2016-12-12 ENCOUNTER — Encounter: Admit: 2016-12-10 | Discharge: 2016-12-10 | Payer: No Typology Code available for payment source

## 2016-12-12 ENCOUNTER — Encounter: Admit: 2016-12-12 | Discharge: 2016-12-12 | Payer: No Typology Code available for payment source

## 2016-12-12 ENCOUNTER — Encounter: Admit: 2016-12-09 | Discharge: 2016-12-09 | Payer: No Typology Code available for payment source

## 2016-12-12 DIAGNOSIS — S31824A Puncture wound with foreign body of left buttock, initial encounter: ICD-10-CM

## 2016-12-12 DIAGNOSIS — L089 Local infection of the skin and subcutaneous tissue, unspecified: ICD-10-CM

## 2016-12-12 DIAGNOSIS — S82451B Displaced comminuted fracture of shaft of right fibula, initial encounter for open fracture type I or II: ICD-10-CM

## 2016-12-12 DIAGNOSIS — F17210 Nicotine dependence, cigarettes, uncomplicated: ICD-10-CM

## 2016-12-12 DIAGNOSIS — J45909 Unspecified asthma, uncomplicated: ICD-10-CM

## 2016-12-12 DIAGNOSIS — Z7901 Long term (current) use of anticoagulants: ICD-10-CM

## 2016-12-12 DIAGNOSIS — S82871B Displaced pilon fracture of right tibia, initial encounter for open fracture type I or II: Principal | ICD-10-CM

## 2016-12-12 DIAGNOSIS — W3400XA Accidental discharge from unspecified firearms or gun, initial encounter: ICD-10-CM

## 2016-12-12 LAB — CBC: Lab: 8.2 K/UL — ABNORMAL LOW (ref 4.5–11.0)

## 2016-12-12 LAB — BASIC METABOLIC PANEL: Lab: 136 MMOL/L — ABNORMAL LOW (ref >60)

## 2016-12-12 MED ORDER — SULFAMETHOXAZOLE-TRIMETHOPRIM 800-160 MG PO TAB
2 | ORAL_TABLET | Freq: Two times a day (BID) | ORAL | 0 refills | Status: AC
Start: 2016-12-12 — End: ?

## 2016-12-12 MED ORDER — SULFAMETHOXAZOLE-TRIMETHOPRIM 800-160 MG PO TAB
2 | Freq: Two times a day (BID) | ORAL | 0 refills | Status: DC
Start: 2016-12-12 — End: 2016-12-12
  Administered 2016-12-12: 17:00:00 2 via ORAL

## 2016-12-12 MED ORDER — SULFAMETHOXAZOLE-TRIMETHOPRIM 800-160 MG PO TAB
2 | ORAL_TABLET | Freq: Two times a day (BID) | ORAL | 0 refills | Status: AC
Start: 2016-12-12 — End: 2016-12-12

## 2016-12-12 MED ORDER — OXYCODONE 5 MG PO TAB
5-10 mg | ORAL_TABLET | ORAL | 0 refills | 6.00000 days | Status: AC | PRN
Start: 2016-12-12 — End: ?

## 2016-12-12 MED ORDER — LINEZOLID 600 MG PO TAB
600 mg | ORAL_TABLET | Freq: Two times a day (BID) | ORAL | 0 refills | Status: AC
Start: 2016-12-12 — End: 2016-12-12

## 2016-12-12 MED ORDER — LINEZOLID 600 MG PO TAB
600 mg | Freq: Two times a day (BID) | ORAL | 0 refills | Status: DC
Start: 2016-12-12 — End: 2016-12-12
  Administered 2016-12-12: 17:00:00 600 mg via ORAL

## 2016-12-12 MED ORDER — ASPIRIN 81 MG PO TBEC
81 mg | ORAL_TABLET | Freq: Every day | ORAL | 0 refills | Status: AC
Start: 2016-12-12 — End: 2019-08-17

## 2016-12-12 MED ORDER — LINEZOLID 600 MG PO TAB
600 mg | ORAL_TABLET | Freq: Two times a day (BID) | ORAL | 0 refills | Status: AC
Start: 2016-12-12 — End: ?

## 2016-12-12 MED ORDER — ASPIRIN 81 MG PO TBEC
81 mg | ORAL_TABLET | Freq: Every day | ORAL | 0 refills | Status: AC
Start: 2016-12-12 — End: 2016-12-12

## 2016-12-12 MED ORDER — GABAPENTIN 300 MG PO CAP
300 mg | ORAL_CAPSULE | ORAL | 1 refills | Status: AC
Start: 2016-12-12 — End: 2019-08-17

## 2016-12-12 NOTE — Progress Notes
Delane GingerBryan Curtis Helman discharged on 12/12/2016.   Marland Kitchen.  Discharge instructions reviewed with patient.  Valuables returned:   Personal Items / Valuables: None  Where Are Valuables Stored?: All belongings stored in [place].  Home medications:    .  Functional assessment at discharge complete: Yes .    Pt received DC instructions and verbalized understanding. IV removed with catheter intact. Pt belongings returned. Pt left via wheelchair with transport.
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
## 2016-12-15 LAB — CULTURE-WOUND/TISSUE/FLUID(AEROBIC ONLY)W/SENSITIVITY

## 2016-12-16 LAB — CULTURE-ANAEROBIC

## 2016-12-17 ENCOUNTER — Encounter: Admit: 2016-12-17 | Discharge: 2016-12-17 | Payer: No Typology Code available for payment source

## 2016-12-17 NOTE — Telephone Encounter
[facility] refusing his [facility] at this time. She stated their practitioner at the jail was thinking about placing him on an Aspirin 325mg  but wanted to double check with Dr. Donnie Ahoilley. This RN told Kriste BasqueBecky the patient may be placed on an Aspirin 325 mg at this time. Kriste BasqueBecky acknowledged this and all questions were answered and understood.
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
## 2016-12-30 ENCOUNTER — Encounter: Admit: 2016-12-30 | Discharge: 2016-12-30 | Payer: No Typology Code available for payment source

## 2016-12-30 DIAGNOSIS — Z9889 Other specified postprocedural states: Principal | ICD-10-CM

## 2017-01-03 ENCOUNTER — Ambulatory Visit: Admit: 2017-01-03 | Discharge: 2017-01-04 | Payer: No Typology Code available for payment source

## 2017-01-03 ENCOUNTER — Ambulatory Visit: Admit: 2017-01-03 | Discharge: 2017-01-03

## 2017-01-03 ENCOUNTER — Encounter: Admit: 2017-01-03 | Discharge: 2017-01-03 | Payer: No Typology Code available for payment source

## 2017-01-03 DIAGNOSIS — W3400XA Accidental discharge from unspecified firearms or gun, initial encounter: ICD-10-CM

## 2017-01-03 DIAGNOSIS — J45909 Unspecified asthma, uncomplicated: Principal | ICD-10-CM

## 2017-01-03 DIAGNOSIS — Z9889 Other specified postprocedural states: Principal | ICD-10-CM
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
## 2017-01-13 LAB — CULTURE-FUNGAL,OTHER

## 2017-01-27 ENCOUNTER — Encounter: Admit: 2017-01-27 | Discharge: 2017-01-27 | Payer: No Typology Code available for payment source

## 2017-01-27 DIAGNOSIS — Z9889 Other specified postprocedural states: Principal | ICD-10-CM

## 2017-01-29 ENCOUNTER — Ambulatory Visit: Admit: 2017-01-29 | Discharge: 2017-01-29

## 2017-01-29 ENCOUNTER — Ambulatory Visit: Admit: 2017-01-29 | Discharge: 2017-01-29 | Payer: No Typology Code available for payment source

## 2017-01-29 DIAGNOSIS — Z9889 Other specified postprocedural states: Principal | ICD-10-CM

## 2017-01-29 DIAGNOSIS — Z8781 Personal history of (healed) traumatic fracture: ICD-10-CM

## 2017-01-29 DIAGNOSIS — Z967 Presence of other bone and tendon implants: Principal | ICD-10-CM

## 2017-03-25 ENCOUNTER — Encounter: Admit: 2017-03-25 | Discharge: 2017-03-25 | Payer: No Typology Code available for payment source

## 2017-03-25 DIAGNOSIS — M25571 Pain in right ankle and joints of right foot: Principal | ICD-10-CM

## 2017-03-26 ENCOUNTER — Ambulatory Visit: Admit: 2017-03-26 | Discharge: 2017-03-27 | Payer: No Typology Code available for payment source

## 2017-03-26 ENCOUNTER — Ambulatory Visit: Admit: 2017-03-26 | Discharge: 2017-03-26

## 2017-03-26 ENCOUNTER — Encounter: Admit: 2017-03-26 | Discharge: 2017-03-26 | Payer: No Typology Code available for payment source

## 2017-03-26 DIAGNOSIS — J45909 Unspecified asthma, uncomplicated: Principal | ICD-10-CM

## 2017-03-26 DIAGNOSIS — Z87821 Personal history of retained foreign body fully removed: ICD-10-CM

## 2017-03-26 DIAGNOSIS — Z9889 Other specified postprocedural states: Principal | ICD-10-CM

## 2017-03-26 DIAGNOSIS — W3400XA Accidental discharge from unspecified firearms or gun, initial encounter: ICD-10-CM

## 2017-03-26 DIAGNOSIS — S82871D Displaced pilon fracture of right tibia, subsequent encounter for closed fracture with routine healing: ICD-10-CM

## 2017-03-26 DIAGNOSIS — M25571 Pain in right ankle and joints of right foot: Principal | ICD-10-CM

## 2017-05-01 ENCOUNTER — Encounter: Admit: 2017-05-01 | Discharge: 2017-05-01 | Payer: No Typology Code available for payment source

## 2017-05-01 DIAGNOSIS — J45909 Unspecified asthma, uncomplicated: Principal | ICD-10-CM

## 2017-05-01 DIAGNOSIS — W3400XA Accidental discharge from unspecified firearms or gun, initial encounter: ICD-10-CM

## 2017-06-20 ENCOUNTER — Encounter: Admit: 2017-06-20 | Discharge: 2017-06-20 | Payer: No Typology Code available for payment source

## 2017-06-20 DIAGNOSIS — Z8781 Personal history of (healed) traumatic fracture: Principal | ICD-10-CM

## 2017-06-25 ENCOUNTER — Ambulatory Visit: Admit: 2017-06-25 | Discharge: 2017-06-25 | Payer: No Typology Code available for payment source

## 2017-06-25 ENCOUNTER — Encounter: Admit: 2017-06-25 | Discharge: 2017-06-25 | Payer: No Typology Code available for payment source

## 2017-06-25 DIAGNOSIS — Z8781 Personal history of (healed) traumatic fracture: Principal | ICD-10-CM

## 2017-06-25 DIAGNOSIS — S82871D Displaced pilon fracture of right tibia, subsequent encounter for closed fracture with routine healing: ICD-10-CM

## 2017-06-25 DIAGNOSIS — Z978 Presence of other specified devices: ICD-10-CM

## 2017-06-25 DIAGNOSIS — T84498A Other mechanical complication of other internal orthopedic devices, implants and grafts, initial encounter: ICD-10-CM

## 2017-06-25 DIAGNOSIS — M25571 Pain in right ankle and joints of right foot: Principal | ICD-10-CM

## 2017-06-25 DIAGNOSIS — W3400XA Accidental discharge from unspecified firearms or gun, initial encounter: ICD-10-CM

## 2017-06-25 DIAGNOSIS — J45909 Unspecified asthma, uncomplicated: Principal | ICD-10-CM

## 2017-07-09 ENCOUNTER — Encounter: Admit: 2017-07-09 | Discharge: 2017-07-09 | Payer: No Typology Code available for payment source

## 2017-07-09 ENCOUNTER — Ambulatory Visit: Admit: 2017-07-09 | Discharge: 2017-07-09 | Payer: No Typology Code available for payment source

## 2017-07-09 DIAGNOSIS — Z978 Presence of other specified devices: ICD-10-CM

## 2017-07-09 DIAGNOSIS — W3400XA Accidental discharge from unspecified firearms or gun, initial encounter: ICD-10-CM

## 2017-07-09 DIAGNOSIS — Z8781 Personal history of (healed) traumatic fracture: ICD-10-CM

## 2017-07-09 DIAGNOSIS — Z9889 Other specified postprocedural states: Principal | ICD-10-CM

## 2017-07-09 DIAGNOSIS — J45909 Unspecified asthma, uncomplicated: Principal | ICD-10-CM

## 2017-07-09 DIAGNOSIS — M19172 Post-traumatic osteoarthritis, left ankle and foot: ICD-10-CM

## 2017-07-09 DIAGNOSIS — M25571 Pain in right ankle and joints of right foot: Principal | ICD-10-CM

## 2018-03-17 ENCOUNTER — Encounter: Admit: 2018-03-17 | Discharge: 2018-03-17 | Payer: No Typology Code available for payment source

## 2018-03-17 DIAGNOSIS — W3400XA Accidental discharge from unspecified firearms or gun, initial encounter: ICD-10-CM

## 2018-03-17 DIAGNOSIS — J45909 Unspecified asthma, uncomplicated: Principal | ICD-10-CM

## 2018-12-22 IMAGING — CR LOW_EXM
3 series · 3 of 3 positions shown · non-contrast
Comparison: none

[ankle ap]
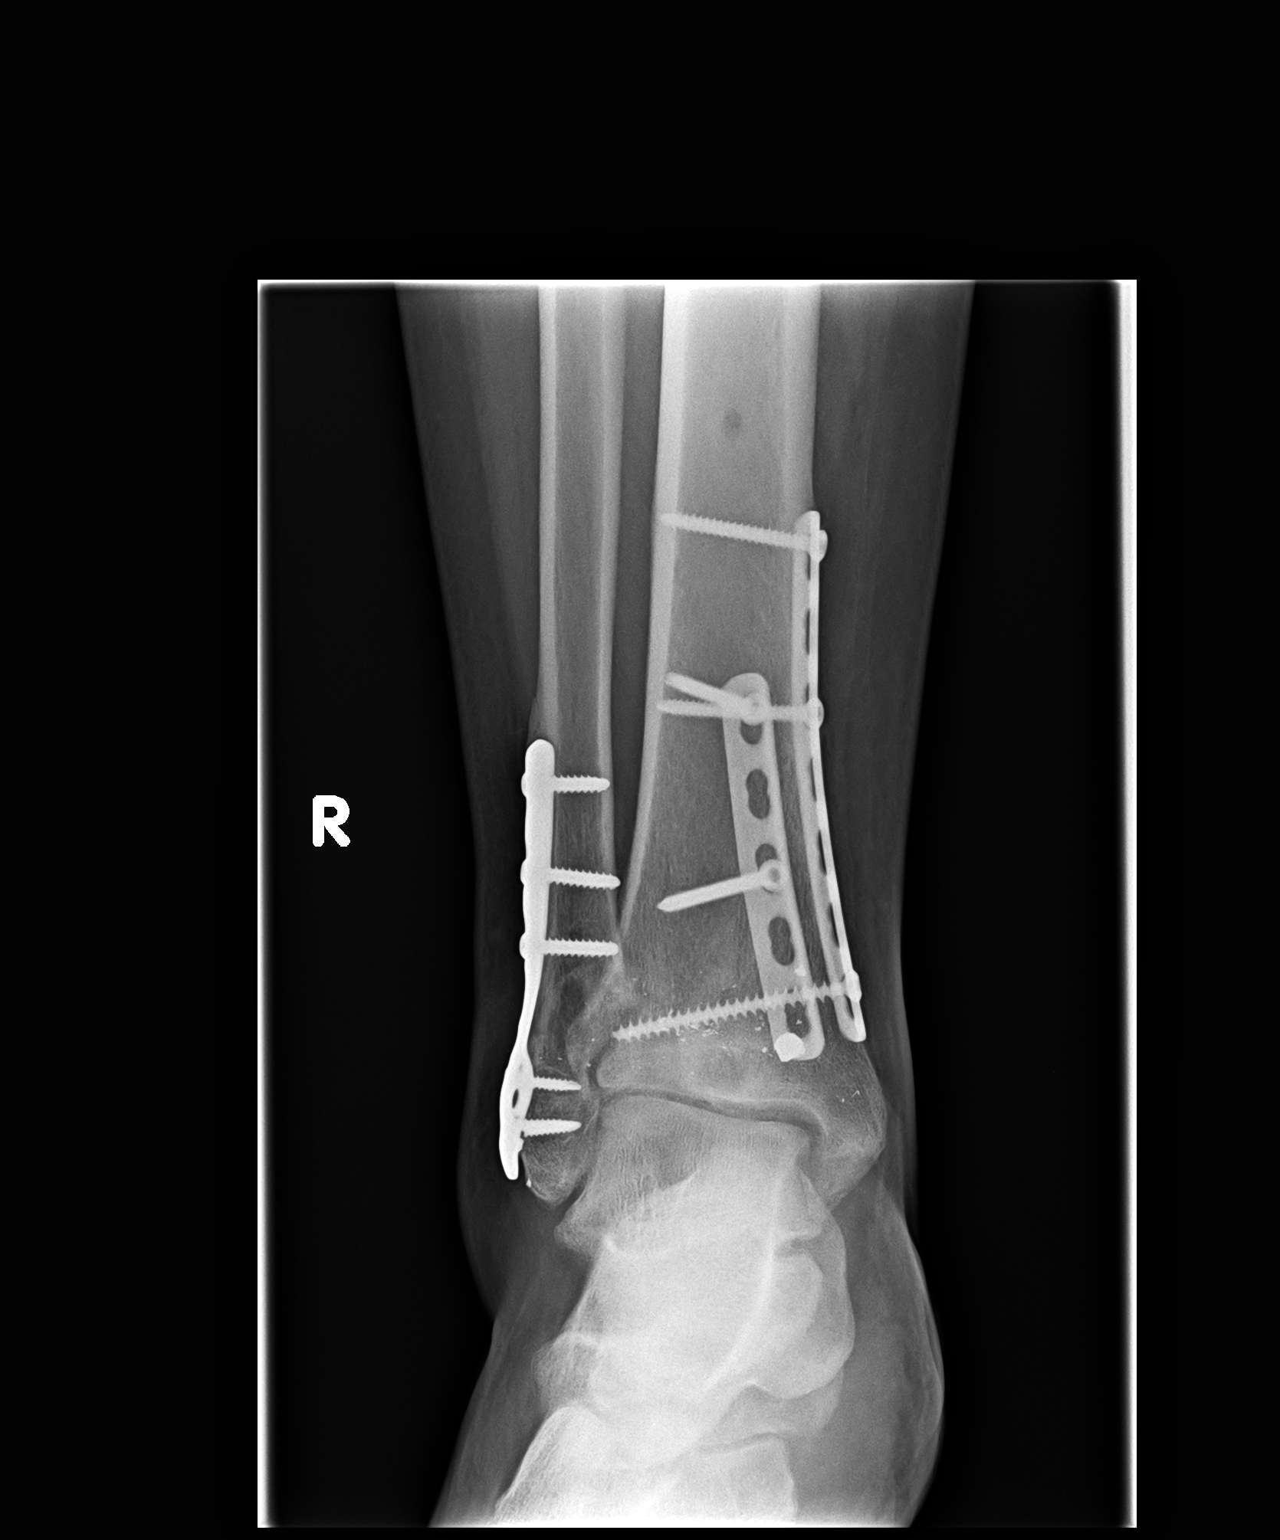

[ankle obl]
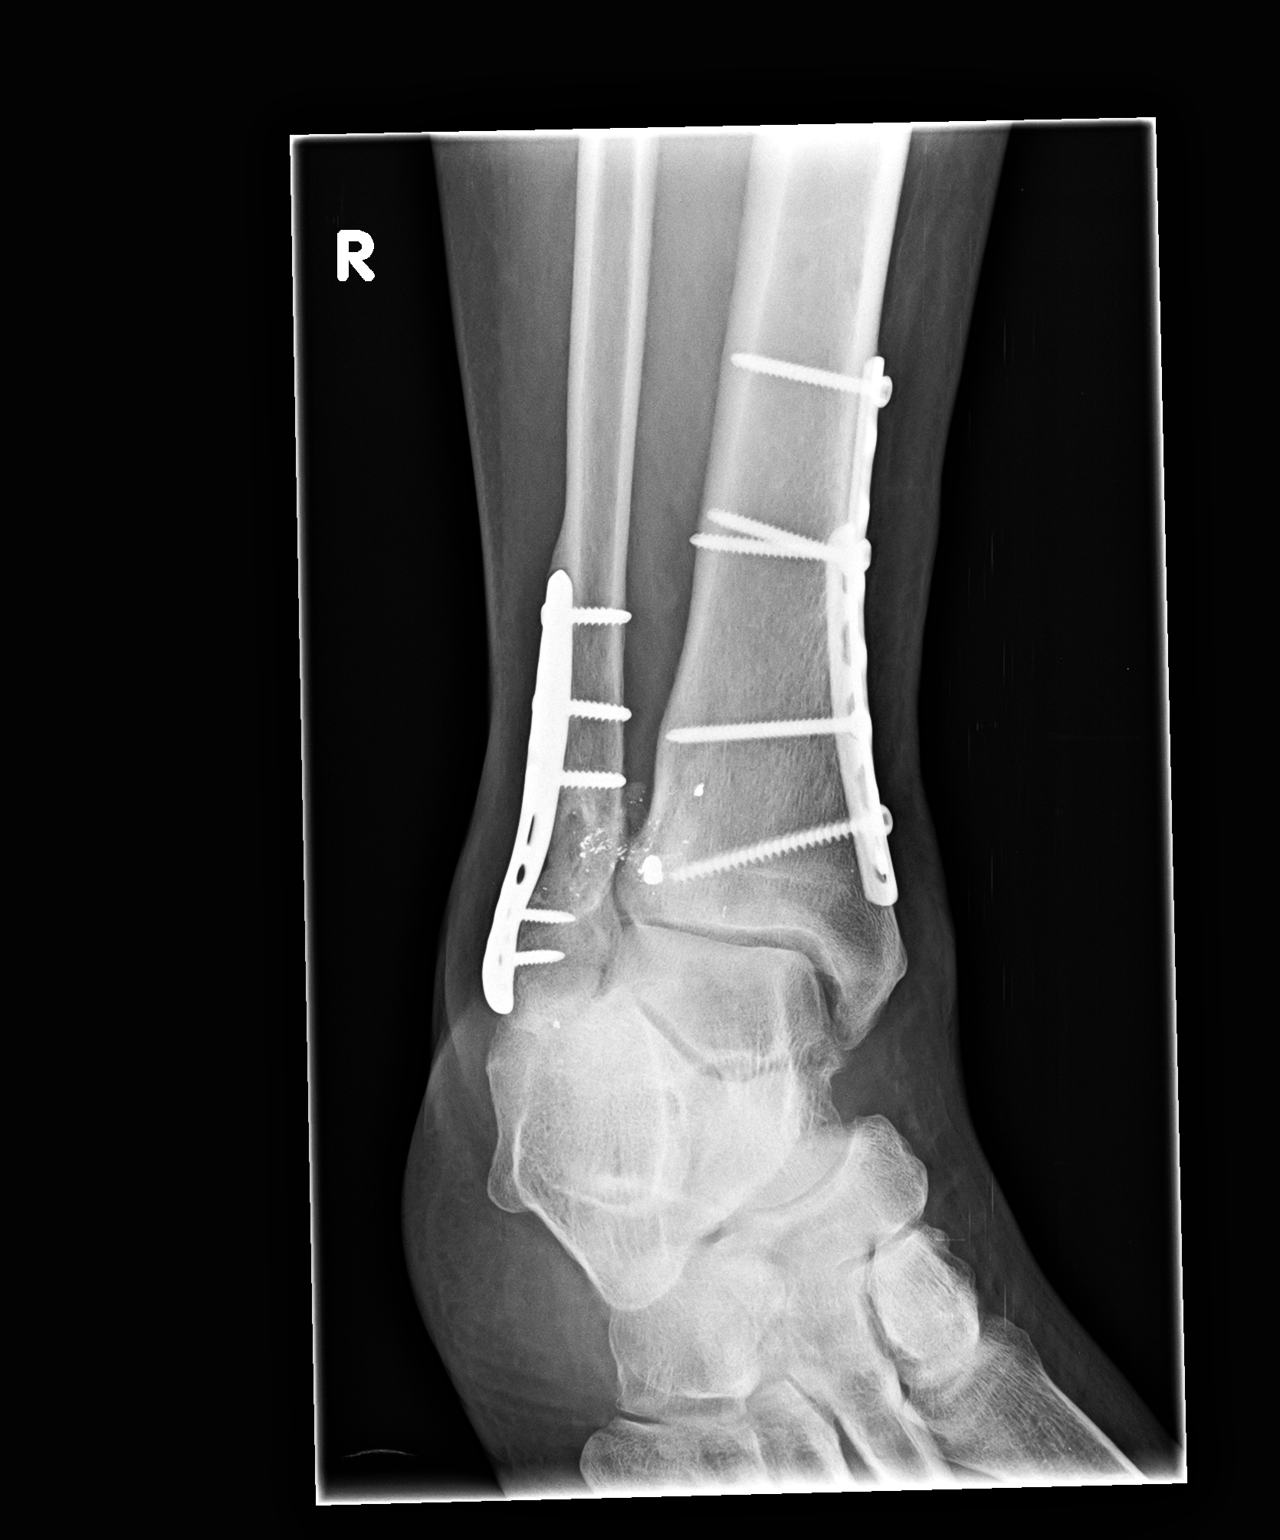

[ankle lat]
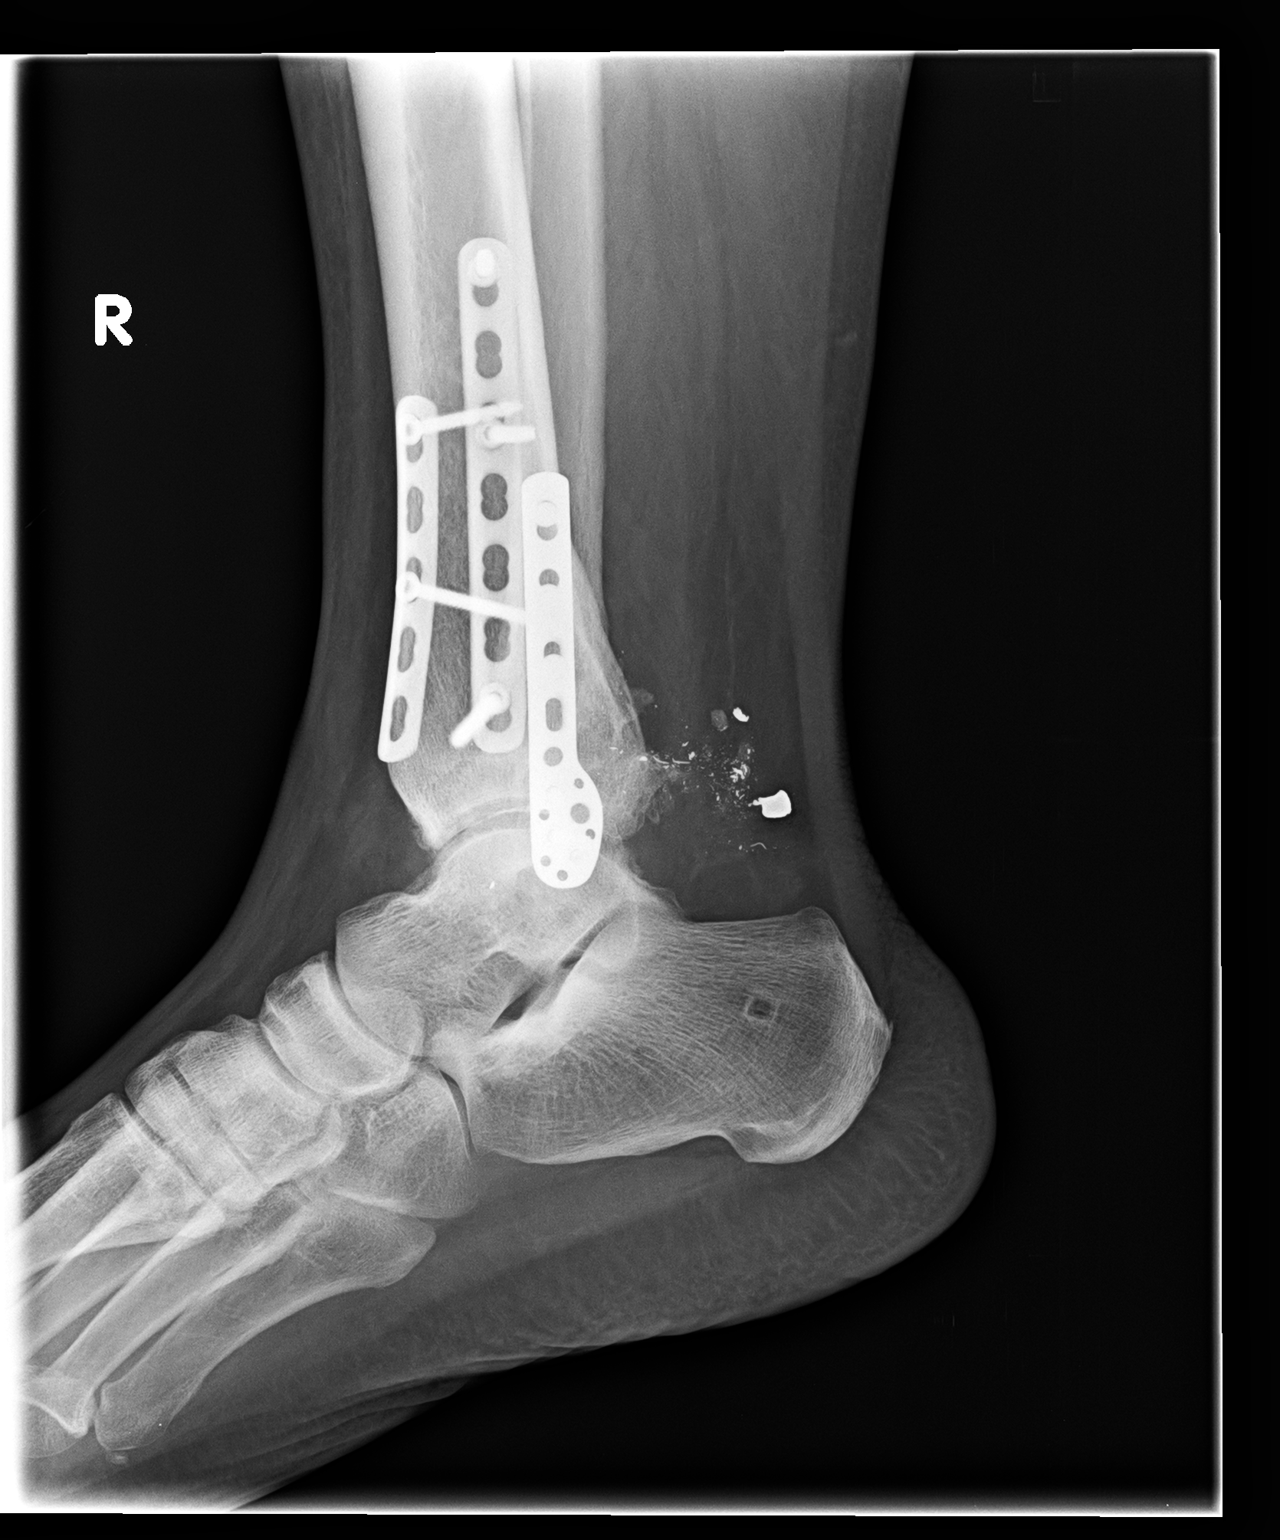

[3 of 3 positions shown; findings below may reference images not displayed]

## 2019-01-04 ENCOUNTER — Encounter: Admit: 2019-01-04 | Discharge: 2019-01-04

## 2019-01-04 DIAGNOSIS — S82871D Displaced pilon fracture of right tibia, subsequent encounter for closed fracture with routine healing: Secondary | ICD-10-CM

## 2019-01-05 ENCOUNTER — Ambulatory Visit: Admit: 2019-01-05 | Discharge: 2019-01-05 | Payer: MEDICAID

## 2019-01-05 ENCOUNTER — Encounter: Admit: 2019-01-05 | Discharge: 2019-01-05 | Payer: MEDICAID

## 2019-01-05 DIAGNOSIS — S82871D Displaced pilon fracture of right tibia, subsequent encounter for closed fracture with routine healing: Secondary | ICD-10-CM

## 2019-01-05 DIAGNOSIS — W3400XA Accidental discharge from unspecified firearms or gun, initial encounter: Secondary | ICD-10-CM

## 2019-01-05 DIAGNOSIS — J45909 Unspecified asthma, uncomplicated: Secondary | ICD-10-CM

## 2019-01-05 MED ORDER — MELOXICAM 15 MG PO TAB
15 mg | ORAL_TABLET | Freq: Every day | ORAL | 3 refills | 30.00000 days | Status: DC
Start: 2019-01-05 — End: 2019-05-05
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
## 2019-03-26 ENCOUNTER — Encounter: Admit: 2019-03-26 | Discharge: 2019-03-26 | Payer: Medicaid Other

## 2019-03-26 DIAGNOSIS — Z8781 Personal history of (healed) traumatic fracture: Secondary | ICD-10-CM

## 2019-03-26 DIAGNOSIS — M25571 Pain in right ankle and joints of right foot: Secondary | ICD-10-CM

## 2019-03-26 NOTE — Progress Notes
Pt LVM nerve test need set up [phone]    LVM for pt- order for EMG has been placed. Left neurology scheduling number for pt to schedule that exam.

## 2019-05-05 ENCOUNTER — Encounter: Admit: 2019-05-05 | Discharge: 2019-05-05 | Payer: Medicaid Other

## 2019-05-05 MED ORDER — MELOXICAM 15 MG PO TAB
ORAL_TABLET | Freq: Every day | ORAL | 3 refills | 30.00000 days | Status: AC
Start: 2019-05-05 — End: ?

## 2019-06-08 ENCOUNTER — Encounter: Admit: 2019-06-08 | Discharge: 2019-06-08 | Payer: Medicaid Other

## 2019-06-08 ENCOUNTER — Ambulatory Visit: Admit: 2019-06-08 | Discharge: 2019-06-08 | Payer: Medicaid Other

## 2019-06-08 DIAGNOSIS — W3400XA Accidental discharge from unspecified firearms or gun, initial encounter: Secondary | ICD-10-CM

## 2019-06-08 DIAGNOSIS — M79604 Pain in right leg: Secondary | ICD-10-CM

## 2019-06-08 DIAGNOSIS — J45909 Unspecified asthma, uncomplicated: Secondary | ICD-10-CM

## 2019-06-08 NOTE — Progress Notes
EMG/NCS Procedure Time Out Check List:  Prior to the start of the procedure, I personally confirmed the following:      Patient Identity (name & date of birth): Yes  Procedure: Yes  Site: Yes  Body Part: Yes    Time out was performed. On the verbal pain analog scale of 0 to 10, patient's pain level was 5 before the procedure and 5 afterwards. Test was completed without complication.     The risks of the procedure, including pain, were discussed with the patient.

## 2019-06-25 ENCOUNTER — Encounter: Admit: 2019-06-25 | Discharge: 2019-06-25 | Payer: Medicaid Other

## 2019-06-25 NOTE — Telephone Encounter
LVM for pt advising his EMG was negative. If CAM made foot feel better can get custom AFO to put in show. If still bothered by tingling sx rec rtc and requested cb to schedule appnt if this is the case or if pt has further questions.

## 2019-08-17 ENCOUNTER — Encounter: Admit: 2019-08-17 | Discharge: 2019-08-17 | Payer: Medicaid Other

## 2019-08-17 ENCOUNTER — Ambulatory Visit: Admit: 2019-08-17 | Discharge: 2019-08-18 | Payer: Medicaid Other

## 2019-08-17 DIAGNOSIS — J45909 Unspecified asthma, uncomplicated: Secondary | ICD-10-CM

## 2019-08-17 DIAGNOSIS — S82391E Other fracture of lower end of right tibia, subsequent encounter for open fracture type I or II with routine healing: Secondary | ICD-10-CM

## 2019-08-17 DIAGNOSIS — M19079 Primary osteoarthritis, unspecified ankle and foot: Secondary | ICD-10-CM

## 2019-08-17 DIAGNOSIS — W3400XA Accidental discharge from unspecified firearms or gun, initial encounter: Secondary | ICD-10-CM

## 2019-08-17 DIAGNOSIS — S82831E Other fracture of upper and lower end of right fibula, subsequent encounter for open fracture type I or II with routine healing: Secondary | ICD-10-CM
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
## 2019-09-27 ENCOUNTER — Encounter: Admit: 2019-09-27 | Discharge: 2019-09-27 | Payer: Medicaid Other

## 2019-09-27 DIAGNOSIS — S82391E Other fracture of lower end of right tibia, subsequent encounter for open fracture type I or II with routine healing: Secondary | ICD-10-CM

## 2019-09-27 DIAGNOSIS — S82871D Displaced pilon fracture of right tibia, subsequent encounter for closed fracture with routine healing: Secondary | ICD-10-CM

## 2019-10-19 ENCOUNTER — Encounter: Admit: 2019-10-19 | Discharge: 2019-10-19 | Payer: Medicaid Other

## 2019-10-19 ENCOUNTER — Ambulatory Visit: Admit: 2019-10-19 | Discharge: 2019-10-19 | Payer: Medicaid Other

## 2019-10-19 DIAGNOSIS — W3400XA Accidental discharge from unspecified firearms or gun, initial encounter: Secondary | ICD-10-CM

## 2019-10-19 DIAGNOSIS — M19079 Primary osteoarthritis, unspecified ankle and foot: Secondary | ICD-10-CM

## 2019-10-19 DIAGNOSIS — J45909 Unspecified asthma, uncomplicated: Secondary | ICD-10-CM

## 2019-10-19 DIAGNOSIS — S82871D Displaced pilon fracture of right tibia, subsequent encounter for closed fracture with routine healing: Secondary | ICD-10-CM
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
## 2019-12-14 ENCOUNTER — Encounter: Admit: 2019-12-14 | Discharge: 2019-12-14 | Payer: Medicaid Other

## 2019-12-14 MED ORDER — MELOXICAM 15 MG PO TAB
15 mg | ORAL_TABLET | Freq: Every day | ORAL | 3 refills | 30.00000 days | Status: AC
Start: 2019-12-14 — End: ?

## 2020-03-27 ENCOUNTER — Encounter: Admit: 2020-03-27 | Discharge: 2020-03-27 | Payer: Medicaid Other

## 2020-03-27 NOTE — Telephone Encounter
Called pt regarding records he confirmed he will bring them to appt

## 2020-04-04 ENCOUNTER — Encounter: Admit: 2020-04-04 | Discharge: 2020-04-04 | Payer: Medicaid Other

## 2020-04-04 ENCOUNTER — Ambulatory Visit: Admit: 2020-04-04 | Discharge: 2020-04-04 | Payer: Medicaid Other

## 2020-04-04 DIAGNOSIS — W3400XA Accidental discharge from unspecified firearms or gun, initial encounter: Secondary | ICD-10-CM

## 2020-04-04 DIAGNOSIS — H918X9 Other specified hearing loss, unspecified ear: Secondary | ICD-10-CM

## 2020-04-04 DIAGNOSIS — J45909 Unspecified asthma, uncomplicated: Secondary | ICD-10-CM

## 2020-04-04 NOTE — Progress Notes
Date of Service: 04/04/2020    Subjective:             Bill [name] is a 50 y.o. male.    History of Present Illness  Bill [name] is referred by [name] for right asymmetric hearing loss.  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  He can barely use a telephone on the right side he says.  He has no headaches or dizziness.     Review of Systems   Constitutional: Negative.    HENT: Positive for hearing loss.    Eyes: Negative.    Respiratory: Negative.    Cardiovascular: Negative.    Gastrointestinal: Negative.    Endocrine: Negative.    Genitourinary: Negative.    Musculoskeletal: Positive for arthralgias and myalgias.   Skin: Negative.    Allergic/Immunologic: Negative.    Neurological: Negative.    Hematological: Negative.    Psychiatric/Behavioral: Negative.          Objective:         ? albuterol (PROAIR HFA, VENTOLIN HFA, OR PROVENTIL HFA) 90 mcg/actuation inhaler Inhale 2 puffs by mouth into the lungs every 6 hours as needed for Wheezing or Shortness of Breath. Shake well before use must see for refil   ? allopurinoL (ZYLOPRIM) 100 mg tablet Take 100 mg by mouth daily.   ? amLODIPine (NORVASC) 10 mg tablet Take 10 mg by mouth daily.   ? atorvastatin (LIPITOR) 40 mg tablet Take 40 mg by mouth at bedtime daily.   ? cyclobenzaprine (FLEXERIL) 5 mg tablet take 1&1/2 tablets BY MOUTH THREE TIMES DAILY AS NEEDED FOR MUSCLE SPASMS   ? meloxicam (MOBIC) 15 mg tablet Take one tablet by mouth daily.   ? montelukast (SINGULAIR) 10 mg tablet Take 10 mg by mouth daily.   ? oxyCODONE (ROXICODONE, OXY-IR) 5 mg tablet Take one tablet to two tablets by mouth every 3 hours as needed   ? oxyCODONE/acetaminophen (PERCOCET) 10/325 mg tablet TAKE ONE TABLET BY MOUTH THREE TIMES DAILY AS NEEDED FOR PAIN   ? SYMBICORT 80-4.5 mcg/actuation inhalation      Vitals:    04/04/20 1437   BP: (!) 142/84   Pulse: 80   Weight: 131.5 kg (290 lb)   Height: 177.8 cm (5' 10)   PainSc: Five     Body mass index is 41.61 kg/m?Marland Kitchen     Physical Exam  Exam today reveals normal eardrums and cranial nerves, his audiogram done by [facility] on 03/10/2020 reveals a left normal to notched moderate hearing loss at 3 kHz on the right normal to profound hearing loss on the high frequencies.  He has 100% discrimination on both sides.       Assessment and Plan:  Bill [name] is referred by [name] for right asymmetric hearing loss.  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  He can barely use a telephone on the right side he says.  He has no headaches or dizziness.    Exam today reveals normal eardrums and cranial nerves, his audiogram done by [facility] on 03/10/2020 reveals a left normal to notched moderate hearing loss at 3 kHz on the right normal to profound hearing loss on the high frequencies.  He has 100% discrimination on both sides.  He has a right asymmetric sensorineural hearing loss, I discussed an MRI with him which we can get set up as well as a hearing aid trial for him through Medicaid here.  I will have him contact me after the MRIs done.

## 2020-04-04 NOTE — Patient Instructions
Please call [phone] to schedule your scan.

## 2020-05-11 ENCOUNTER — Encounter: Admit: 2020-05-11 | Discharge: 2020-05-11 | Payer: Medicaid Other

## 2020-05-11 ENCOUNTER — Ambulatory Visit: Admit: 2020-05-11 | Discharge: 2020-05-11 | Payer: Medicaid Other

## 2020-05-11 DIAGNOSIS — H918X9 Other specified hearing loss, unspecified ear: Secondary | ICD-10-CM

## 2020-05-11 NOTE — Telephone Encounter
-----   Message from Paulene Floor, MD sent at 05/11/2020 10:26 AM CDT -----  Can you please let him know that no tumors or growths are noted on his MRI?  Thanks!    jl

## 2020-05-11 NOTE — Telephone Encounter
LVM relaying MRI results per Dr. Lin. Provided callback number.

## 2020-05-29 ENCOUNTER — Encounter: Admit: 2020-05-29 | Discharge: 2020-05-29 | Payer: Medicaid Other

## 2020-05-29 ENCOUNTER — Ambulatory Visit: Admit: 2020-05-29 | Discharge: 2020-05-29 | Payer: Medicaid Other

## 2020-05-29 DIAGNOSIS — 1 ERRONEOUS ENCOUNTER--DISREGARD: Secondary | ICD-10-CM

## 2020-05-29 DIAGNOSIS — J45909 Unspecified asthma, uncomplicated: Secondary | ICD-10-CM

## 2020-05-29 DIAGNOSIS — W3400XA Accidental discharge from unspecified firearms or gun, initial encounter: Secondary | ICD-10-CM

## 2020-05-29 NOTE — Progress Notes
Date of Service: 05/29/2020    Subjective:             Bill [name] is a 50 y.o. male.    History of Present Illness  Erroneous encounter       Review of Systems   Constitutional: Negative.    HENT: Negative.    Eyes: Negative.    Respiratory: Negative.    Cardiovascular: Negative.    Gastrointestinal: Negative.    Endocrine: Negative.    Genitourinary: Negative.    Musculoskeletal: Negative.    Skin: Negative.    Allergic/Immunologic: Negative.    Neurological: Negative.    Hematological: Negative.    Psychiatric/Behavioral: Negative.          Objective:          albuterol (PROAIR HFA, VENTOLIN HFA, OR PROVENTIL HFA) 90 mcg/actuation inhaler Inhale 2 puffs by mouth into the lungs every 6 hours as needed for Wheezing or Shortness of Breath. Shake well before use must see for refil    allopurinoL (ZYLOPRIM) 100 mg tablet Take 100 mg by mouth daily.    amLODIPine (NORVASC) 10 mg tablet Take 10 mg by mouth daily.    atorvastatin (LIPITOR) 40 mg tablet Take 40 mg by mouth at bedtime daily.    cyclobenzaprine (FLEXERIL) 5 mg tablet take 1&1/2 tablets BY MOUTH THREE TIMES DAILY AS NEEDED FOR MUSCLE SPASMS    meloxicam (MOBIC) 15 mg tablet Take one tablet by mouth daily.    montelukast (SINGULAIR) 10 mg tablet Take 10 mg by mouth daily.    oxyCODONE (ROXICODONE, OXY-IR) 5 mg tablet Take one tablet to two tablets by mouth every 3 hours as needed    oxyCODONE/acetaminophen (PERCOCET) 10/325 mg tablet TAKE ONE TABLET BY MOUTH THREE TIMES DAILY AS NEEDED FOR PAIN    SYMBICORT 80-4.5 mcg/actuation inhalation      Vitals:    05/29/20 1405   BP: (!) 146/87   Pulse: 80   PainSc: Five   Weight: 131.5 kg (290 lb)   Height: 177.8 cm (5\' 10" )     Body mass index is 41.61 kg/m.     Physical Exam         Assessment and Plan:  Erroneous encounter

## 2020-10-05 ENCOUNTER — Encounter: Admit: 2020-10-05 | Discharge: 2020-10-05 | Payer: Medicaid Other

## 2020-10-05 ENCOUNTER — Ambulatory Visit: Admit: 2020-10-05 | Discharge: 2020-10-05 | Payer: Medicaid Other

## 2020-10-05 DIAGNOSIS — H903 Sensorineural hearing loss, bilateral: Secondary | ICD-10-CM

## 2020-10-05 NOTE — Progress Notes
Mr. Authement was seen today for a hearing aid consult.  He is interested in obtaining a hearing aid through his [employer] community plan medical card.     Recent diagnostic hearing test revealed hearing to be a normal sloping to profound sensorineural hearing loss in the right ear and normal sloping to moderate sensorineural hearing loss in the left ear.  Medical clearance for amplification was obtained in May 2022 (date) by Dr. Juel Burrow.    his insurance covers 1 hearing aid every 4 years.  OR    his qualifies for binaural hearing aids.     For binaural hearing aids, the patient must meet one of the following criteria:   Child under the age of 7    Legally Blind    Previous Binural user    Occupational requirement for binaural listening     He does not qualify for binaural amplification.      Phonak M30 rechargeable RIC in chestnut for his right ear will be ordered. An earmold impression was taken with no difficulty.    He is scheduled for a fitting on 11/08/20.

## 2020-11-08 ENCOUNTER — Ambulatory Visit: Admit: 2020-11-08 | Discharge: 2020-11-08 | Payer: Medicaid Other

## 2020-11-08 ENCOUNTER — Encounter: Admit: 2020-11-08 | Discharge: 2020-11-08 | Payer: Medicaid Other

## 2020-11-08 DIAGNOSIS — H903 Sensorineural hearing loss, bilateral: Secondary | ICD-10-CM

## 2020-11-08 NOTE — Progress Notes
Bill [name] was fit with a [name] P30 hearing aid today.    Settings were programmed to patient's satisfaction and the hearing aid was set to 100% gain for Bill [name] prescriptive targets.  Use and care for the aid was explained in detail as well as given in written form within the user's manual. He demonstrated that he was able to change the battery. Bill [name] also demonstrated that he was able to insert and remove the aid with little to no difficulty.     Bill [name] will follow up in 2 month(s).The aid will be billed to his insurance. (Medicaid)    Manufacturer: Bill ButtersRoger [name] P30  Serial Numbers: Right: [id number]  Receiver Length: Right: 2  Receiver Strength: Power  Repair Warranty: 02/04/2024  L&D Warranty: 02/04/2024  Battery: Rechargeable  Domes: Custom mold [id number]  Programs: N/A  Volume: N/A  Fit Date: 11/08/2020  Purchase Type: Medicaid Insurance

## 2020-11-29 ENCOUNTER — Encounter: Admit: 2020-11-29 | Discharge: 2020-11-29 | Payer: Medicaid Other

## 2020-11-29 MED ORDER — MELOXICAM 15 MG PO TAB
15 mg | ORAL_TABLET | Freq: Every day | ORAL | 3 refills | 30.00000 days | Status: AC
Start: 2020-11-29 — End: ?

## 2020-11-29 NOTE — Telephone Encounter
Sarahw/  [facility] [phone] stating pt req RF meloxicam 15 mg. RF sent to pharmacy as requested.

## 2020-11-30 ENCOUNTER — Encounter: Admit: 2020-11-30 | Discharge: 2020-11-30 | Payer: Medicaid Other

## 2020-11-30 NOTE — Telephone Encounter
Pt calling regarding has lost his right Aid needs to order a new one

## 2020-12-29 ENCOUNTER — Ambulatory Visit: Admit: 2020-12-29 | Discharge: 2020-12-29 | Payer: Medicaid Other

## 2020-12-29 ENCOUNTER — Encounter: Admit: 2020-12-29 | Discharge: 2020-12-29 | Payer: Medicaid Other
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
